# Patient Record
Sex: Male | Born: 1995 | Race: White | Hispanic: No | Marital: Married | Smoking: Current every day smoker
Health system: Southern US, Community
[De-identification: ages and names within clinical notes are randomized; demographics above are authoritative.]

## PROBLEM LIST (undated history)

## (undated) DIAGNOSIS — N200 Calculus of kidney: Secondary | ICD-10-CM

## (undated) HISTORY — DX: Calculus of kidney: N20.0

---
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
## 2014-09-04 ENCOUNTER — Emergency Department: Payer: Self-pay

## 2014-09-04 ENCOUNTER — Emergency Department
Admission: EM | Admit: 2014-09-04 | Discharge: 2014-09-04 | Disposition: A | Discharge status: Home / Self Care | Payer: Self-pay | Attending: Emergency Medicine | Admitting: Emergency Medicine

## 2014-09-04 DIAGNOSIS — Z720 Tobacco use: Secondary | ICD-10-CM | POA: Insufficient documentation

## 2014-09-04 DIAGNOSIS — R112 Nausea with vomiting, unspecified: Secondary | ICD-10-CM | POA: Insufficient documentation

## 2014-09-04 DIAGNOSIS — R109 Unspecified abdominal pain: Secondary | ICD-10-CM | POA: Insufficient documentation

## 2014-09-04 LAB — URINALYSIS COMPLETE WITH MICROSCOPIC (ARMC ONLY)
BILIRUBIN URINE: NEGATIVE
Glucose, UA: NEGATIVE mg/dL
LEUKOCYTES UA: NEGATIVE
Nitrite: NEGATIVE
PH: 5 (ref 5.0–8.0)
Protein, ur: 100 mg/dL — AB
Specific Gravity, Urine: 1.04 — ABNORMAL HIGH (ref 1.005–1.030)

## 2014-09-04 LAB — BASIC METABOLIC PANEL
ANION GAP: 8 (ref 5–15)
BUN: 24 mg/dL — ABNORMAL HIGH (ref 6–20)
CHLORIDE: 105 mmol/L (ref 101–111)
CO2: 26 mmol/L (ref 22–32)
CREATININE: 1.1 mg/dL (ref 0.61–1.24)
Calcium: 9.5 mg/dL (ref 8.9–10.3)
GFR calc Af Amer: >60 mL/min (ref >60)
GFR calc non Af Amer: >60 mL/min (ref >60)
Glucose, Bld: 109 mg/dL — ABNORMAL HIGH (ref 65–99)
POTASSIUM: 4 mmol/L (ref 3.5–5.1)
Sodium: 139 mmol/L (ref 135–145)

## 2014-09-04 LAB — CBC
HCT: 43.2 % (ref 40.0–52.0)
Hemoglobin: 14.3 g/dL (ref 13.0–18.0)
MCH: 29.6 pg (ref 26.0–34.0)
MCHC: 33.1 g/dL (ref 32.0–36.0)
MCV: 89.5 fL (ref 80.0–100.0)
Platelets: 184 10*3/uL (ref 150–440)
RBC: 4.83 MIL/uL (ref 4.40–5.90)
RDW: 13.4 % (ref 11.5–14.5)
WBC: 11.1 10*3/uL — ABNORMAL HIGH (ref 3.8–10.6)

## 2014-09-04 MED ORDER — HYDROCODONE-ACETAMINOPHEN 5-325 MG PO TABS: 1.0000 | ORAL_TABLET | Freq: Four times a day (QID) | ORAL | Status: DC | PRN

## 2014-09-04 MED ORDER — CEPHALEXIN 500 MG PO CAPS
500.0000 mg | ORAL_CAPSULE | Freq: Once | ORAL | Status: AC
  Administered 2014-09-04: 500 mg via ORAL
  Filled 2014-09-04: qty 1

## 2014-09-04 MED ORDER — ONDANSETRON 4 MG PO TBDP: 4.0000 mg | ORAL_TABLET | Freq: Four times a day (QID) | ORAL | Status: DC | PRN

## 2014-09-04 MED ORDER — CEPHALEXIN 500 MG PO CAPS: 500.0000 mg | ORAL_CAPSULE | Freq: Four times a day (QID) | ORAL | Status: DC

## 2014-09-04 MED ORDER — ONDANSETRON 4 MG PO TBDP
4.0000 mg | ORAL_TABLET | Freq: Once | ORAL | Status: AC
  Administered 2014-09-04: 4 mg via ORAL
  Filled 2014-09-04: qty 1

## 2014-09-04 MED ORDER — IBUPROFEN 600 MG PO TABS
600.0000 mg | ORAL_TABLET | ORAL | Status: AC
  Administered 2014-09-04: 600 mg via ORAL
  Filled 2014-09-04: qty 1

## 2014-09-04 MED ORDER — HYDROCODONE-ACETAMINOPHEN 5-325 MG PO TABS
1.0000 | ORAL_TABLET | Freq: Once | ORAL | Status: AC
  Administered 2014-09-04: 1 via ORAL
  Filled 2014-09-04: qty 1

## 2014-09-04 NOTE — ED Notes (Addendum)
Pt c/o waking this morning earlier and was not able to urinate or only in small amount with a lot of suprapubic pain, later today still unable to urinate and is now having left flank pain with N/V.Marland Kitchen

## 2014-09-04 NOTE — ED Provider Notes (Signed)
[facility] Emergency Department Provider Note  ____________________________________________  Time seen: Approximately 5:24 PM  I have reviewed the triage vital signs and the nursing notes.   HISTORY  Chief Complaint Urinary Retention and Flank Pain    HPI Curtis Edwards is a 19 y.o. male who woke up this morning with a severe pain in his left flank that felt like someone had kicked him repeatedly in the back. He also reports that he's been having a severe urge to urinate since that time. He had some nausea and did vomit once because of pain. No fevers or chills, though he did have some mild "cold" like symptoms earlier last week that resolved.  No penile discharge. 6 with wife only. No groin pain or mass. No testicular pain. No cough, fevers, chills, chest pain or other symptoms. No pain on the right side of the abdomen or right lower abdomen.  He also reports that his pain got quite a bit better about 2 hours ago, but is still aching in the left flank. He is able to urinate, but states he has an urge that he continuously needs to empty his bladder.  Discussed with the patient pain medicine, and he has used Vicodin previously without problems for tooth pain.  History reviewed. No pertinent past medical history.  There are no active problems to display for this patient.   History reviewed. No pertinent past surgical history.  Current Outpatient Rx  Name  Route  Sig  Dispense  Refill  . cephALEXin (KEFLEX) 500 MG capsule   Oral   Take 1 capsule (500 mg total) by mouth 4 (four) times daily.   40 capsule   0   . HYDROcodone-acetaminophen (NORCO/VICODIN) 5-325 MG per tablet   Oral   Take 1 tablet by mouth every 6 (six) hours as needed for moderate pain.   15 tablet   0   . ondansetron (ZOFRAN ODT) 4 MG disintegrating tablet   Oral   Take 1 tablet (4 mg total) by mouth every 6 (six) hours as needed for nausea or vomiting.   20 tablet   0      Allergies Review of patient's allergies indicates no known allergies.  No family history on file.  Social History History  Substance Use Topics  . Smoking status: Current Every Day Smoker    Types: Cigarettes  . Smokeless tobacco: Never Used  . Alcohol Use: No    Review of Systems Constitutional: No fever/chills Eyes: No visual changes. ENT: No sore throat. Cardiovascular: Denies chest pain. Respiratory: Denies shortness of breath. Gastrointestinal: See history of present illness No diarrhea.  No constipation. Genitourinary: Severe urge to urinate, but no pain with urination. Musculoskeletal: Negative for back pain but does have pain around the left flank. Skin: Negative for rash. Neurological: Negative for headaches, focal weakness or numbness.  10-point ROS otherwise negative.  ____________________________________________   PHYSICAL EXAM:  VITAL SIGNS: ED Triage Vitals  Enc Vitals Group     BP 09/04/14 1532 114/76 mmHg     Pulse Rate 09/04/14 1530 78     Resp 09/04/14 1530 18     Temp 09/04/14 1530 97.9 F (36.6 C)     Temp Source 09/04/14 1530 Oral     SpO2 09/04/14 1530 99 %     Weight 09/04/14 1530 120 lb (54.432 kg)     Height 09/04/14 1530 5\' 9"  (1.753 m)     Head Cir --  Peak Flow --      Pain Score 09/04/14 1531 9     Pain Loc --      Pain Edu? --      Excl. in GC? --     Constitutional: Alert and oriented. Well appearing and in no acute distress. Eyes: Conjunctivae are normal. PERRL. EOMI. Head: Atraumatic. Nose: No congestion/rhinnorhea. Mouth/Throat: Mucous membranes are moist.  Oropharynx non-erythematous. Neck: No stridor.   Cardiovascular: Normal rate, regular rhythm. Grossly normal heart sounds.  Good peripheral circulation. Respiratory: Normal respiratory effort.  No retractions. Lungs CTAB. Gastrointestinal: Soft and nontender. No right lower quadrant pain. No distention. No abdominal bruits. Moderate left-sided CVA  tenderness. Normal circumcised penis. Normal testicles nontender. No groin pain or mass. No testicular or scrotal swelling. Musculoskeletal: No lower extremity tenderness nor edema.  No joint effusions. Neurologic:  Normal speech and language. No gross focal neurologic deficits are appreciated. No gait instability. Skin:  Skin is warm, dry and intact. No rash noted. Psychiatric: Mood and affect are normal. Speech and behavior are normal.  ____________________________________________   LABS (all labs ordered are listed, but only abnormal results are displayed)  Labs Reviewed  BASIC METABOLIC PANEL - Abnormal; Notable for the following:    Glucose, Bld 109 (*)    BUN 24 (*)    All other components within normal limits  CBC - Abnormal; Notable for the following:    WBC 11.1 (*)    All other components within normal limits  URINALYSIS COMPLETEWITH MICROSCOPIC ([facility] ONLY) - Abnormal; Notable for the following:    Color, Urine AMBER (*)    APPearance CLOUDY (*)    Ketones, ur TRACE (*)    Specific Gravity, Urine 1.040 (*)    Hgb urine dipstick 3+ (*)    Protein, ur 100 (*)    Bacteria, UA RARE (*)    Squamous Epithelial / LPF 0-5 (*)    All other components within normal limits  URINE CULTURE   ____________________________________________  EKG   ____________________________________________  RADIOLOGY  Final result by Rad Results In Interface (09/04/14 17:58:17)   Narrative:   CLINICAL DATA: Acute left flank pain since this morning. Dysuria, hematuria.  EXAM: CT ABDOMEN AND PELVIS WITHOUT CONTRAST  TECHNIQUE: Multidetector CT imaging of the abdomen and pelvis was performed following the standard protocol without IV contrast.  COMPARISON: None.  FINDINGS: Lung bases are clear. No effusions. Heart is normal size.  Liver, gallbladder, spleen, pancreas, adrenals and left kidney have an unremarkable unenhanced appearance. Punctate nonobstructing stone in the  upper pole of the right kidney. No ureteral stones or hydronephrosis. Urinary bladder is decompressed.  Stomach, large and small bowel are unremarkable. No free fluid, free air or adenopathy. Appendix is visualized, filled with slightly hyperdense material, and unremarkable.  No acute bony abnormality or focal bone lesion.  IMPRESSION: Punctate right upper pole nephrolithiasis. No ureteral stones or hydronephrosis.  No acute findings in the abdomen or pelvis.     ____________________________________________   PROCEDURES  Procedure(s) performed: None  Critical Care performed: No  ____________________________________________  I will prescribe the patient a narcotic pain medicine due to their condition which I anticipate will cause at least moderate pain short term. I discussed with the patient safe use of narcotic pain medicines, and that they are not to drive, work in dangerous areas, or ever take more than prescribed (no more than 1 pill every 6 hours). We discussed that this is the type of medication that "Criss Alvine" may have  overdosed on and the risks of this type of medicine. Patient is very agreeable to only use as prescribed and to never use more than prescribed.   INITIAL IMPRESSION / ASSESSMENT AND PLAN / ED COURSE  Pertinent labs & imaging results that were available during my care of the patient were reviewed by me and considered in my medical decision making (see chart for details).  Sudden left flank pain. Reassuring exam, afebrile with only slightly elevated white count. This likely represents a kidney stone based on my clinical impression. No symptoms of appendicitis or acute intra-abdominal infection. We'll obtain CT imaging to further evaluate as he has no previous history of kidney stone. His symptoms have improved, possibly has passed it. Urinalysis showed lots of red cells, a few white cells and is likely not acutely infected but I will send a culture and place him  on Keflex because a few bacteria are noted along with a few white cells.  I discussed treatment recommendations, return precautions, and close follow-up care. The patient is agreeable. Wife will drive him home. He is to follow-up urology and we discussed that should he develop a fever, shaking chills, abnormal urine odor, white or cloudy urine, severe abdominal pain, weakness, or other new concerns arise that he should return to the emergency room right away.  ----------------------------------------- 6:46 PM on 09/04/2014 -----------------------------------------  Patient feels much improved. Awake and alert in no distress. Reviewed CT demonstrates no acute findings. Based on his hematuria and left flank pain which is improved significantly, I suspect he likely passed a left-sided kidney stone. Return precautions and follow-up care advised. ____________________________________________   FINAL CLINICAL IMPRESSION(S) / ED DIAGNOSES  Final diagnoses:  Left flank pain   hematuria    Sharyn Creamer, MD 09/04/14 1900

## 2014-09-04 NOTE — ED Notes (Signed)
Bladder scan complete with only 16ml noted.Marland Kitchen

## 2014-09-04 NOTE — ED Notes (Signed)
NAD noted at time of D/C. Pt refused wheelchair to the lobby at this time.  

## 2014-09-04 NOTE — ED Notes (Signed)
Pt taken to CT.

## 2014-09-04 NOTE — Discharge Instructions (Signed)
You have been seen in the Emergency Department (ED) today for pain that we believe based on your workup, is caused by kidney stones.  As we have discussed, please drink plenty of fluids.  Please make a follow up appointment with the physician(s) listed elsewhere in this documentation. ° °You may take pain medication as needed but ONLY as prescribed.  Please also take your prescribed Flomax daily.  We also recommend that you take over-the-counter ibuprofen regularly according to label instructions over the next 5 days.  Take it with meals to minimize stomach discomfort. ° °Please see your doctor as soon as possible as stones may take 1-3 weeks to pass and you may require additional care or medications. ° °Do not drink alcohol, drive or participate in any other potentially dangerous activities while taking opiate pain medication as it may make you sleepy. Do not take this medication with any other sedating medications, either prescription or over-the-counter. If you were prescribed Percocet or Vicodin, do not take these with acetaminophen (Tylenol) as it is already contained within these medications. °  °This medication is an opiate (or narcotic) pain medication and can be habit forming.  Use it as little as possible to achieve adequate pain control.  Do not use or use it with extreme caution if you have a history of opiate abuse or dependence.  If you are on a pain contract with your primary care doctor or a pain specialist, be sure to let them know you were prescribed this medication today from the [facility].  This medication is intended for your use only - do not give any to anyone else and keep it in a secure place where nobody else, especially children, have access to it.  It will also cause or worsen constipation, so you may want to consider taking an over-the-counter stool softener while you are taking this medication. ° °Return to the Emergency Department (ED) or call your doctor  if you have any worsening pain, fever, painful urination, are unable to urinate, or develop other symptoms that concern you. ° °

## 2014-09-06 LAB — URINE CULTURE: Special Requests: NORMAL

## 2015-01-30 ENCOUNTER — Emergency Department
Admission: EM | Admit: 2015-01-30 | Discharge: 2015-01-30 | Disposition: A | Discharge status: Home / Self Care | Payer: Self-pay | Attending: Emergency Medicine | Admitting: Emergency Medicine

## 2015-01-30 ENCOUNTER — Encounter: Payer: Self-pay | Admitting: *Deleted

## 2015-01-30 ENCOUNTER — Emergency Department: Payer: Self-pay

## 2015-01-30 DIAGNOSIS — S91321A Laceration with foreign body, right foot, initial encounter: Secondary | ICD-10-CM | POA: Insufficient documentation

## 2015-01-30 DIAGNOSIS — Y9289 Other specified places as the place of occurrence of the external cause: Secondary | ICD-10-CM | POA: Insufficient documentation

## 2015-01-30 DIAGNOSIS — Y9389 Activity, other specified: Secondary | ICD-10-CM | POA: Insufficient documentation

## 2015-01-30 DIAGNOSIS — F17210 Nicotine dependence, cigarettes, uncomplicated: Secondary | ICD-10-CM | POA: Insufficient documentation

## 2015-01-30 DIAGNOSIS — S91311A Laceration without foreign body, right foot, initial encounter: Secondary | ICD-10-CM

## 2015-01-30 DIAGNOSIS — Y998 Other external cause status: Secondary | ICD-10-CM | POA: Insufficient documentation

## 2015-01-30 DIAGNOSIS — W01118A Fall on same level from slipping, tripping and stumbling with subsequent striking against other sharp object, initial encounter: Secondary | ICD-10-CM | POA: Insufficient documentation

## 2015-01-30 MED ORDER — LIDOCAINE-EPINEPHRINE (PF) 1 %-1:200000 IJ SOLN
INTRAMUSCULAR | Status: AC
  Filled 2015-01-30: qty 30

## 2015-01-30 MED ORDER — CEPHALEXIN 500 MG PO CAPS: 500.0000 mg | ORAL_CAPSULE | Freq: Four times a day (QID) | ORAL | Status: DC

## 2015-01-30 MED ORDER — OXYCODONE-ACETAMINOPHEN 5-325 MG PO TABS: 1.0000 | ORAL_TABLET | Freq: Once | ORAL | Status: DC

## 2015-01-30 MED ORDER — OXYCODONE-ACETAMINOPHEN 5-325 MG PO TABS
1.0000 | ORAL_TABLET | Freq: Once | ORAL | Status: AC
  Administered 2015-01-30: 1 via ORAL
  Filled 2015-01-30: qty 1

## 2015-01-30 MED ORDER — HYDROCODONE-ACETAMINOPHEN 5-325 MG PO TABS: 1.0000 | ORAL_TABLET | ORAL | Status: DC | PRN

## 2015-01-30 MED ORDER — LIDOCAINE-EPINEPHRINE (PF) 2 %-1:200000 IJ SOLN
20.0000 mL | Freq: Once | INTRAMUSCULAR | Status: DC
  Filled 2015-01-30: qty 20

## 2015-01-30 MED ORDER — LIDOCAINE HCL (PF) 1 % IJ SOLN
INTRAMUSCULAR | Status: AC
  Filled 2015-01-30: qty 10

## 2015-01-30 NOTE — ED Notes (Signed)
Pt foot placed in 1/2 NS, 1/2 betadine solution at this time.

## 2015-01-30 NOTE — Discharge Instructions (Signed)
Laceration Care, Adult °A laceration is a cut that goes through all of the layers of the skin and into the tissue that is right under the skin. Some lacerations heal on their own. Others need to be closed with stitches (sutures), staples, skin adhesive strips, or skin glue. Proper laceration care minimizes the risk of infection and helps the laceration to heal better. °HOW TO CARE FOR YOUR LACERATION °If sutures or staples were used: °· Keep the wound clean and dry. °· If you were given a bandage (dressing), you should change it at least one time per day or as told by your health care provider. You should also change it if it becomes wet or dirty. °· Keep the wound completely dry for the first 24 hours or as told by your health care provider. After that time, you may shower or bathe. However, make sure that the wound is not soaked in water until after the sutures or staples have been removed. °· Clean the wound one time each day or as told by your health care provider: °· Wash the wound with soap and water. °· Rinse the wound with water to remove all soap. °· Pat the wound dry with a clean towel. Do not rub the wound. °· After cleaning the wound, apply a thin layer of antibiotic ointment as told by your health care provider. This will help to prevent infection and keep the dressing from sticking to the wound. °· Have the sutures or staples removed as told by your health care provider. °If skin adhesive strips were used: °· Keep the wound clean and dry. °· If you were given a bandage (dressing), you should change it at least one time per day or as told by your health care provider. You should also change it if it becomes dirty or wet. °· Do not get the skin adhesive strips wet. You may shower or bathe, but be careful to keep the wound dry. °· If the wound gets wet, pat it dry with a clean towel. Do not rub the wound. °· Skin adhesive strips fall off on their own. You may trim the strips as the wound heals. Do not  remove skin adhesive strips that are still stuck to the wound. They will fall off in time. °If skin glue was used: °· Try to keep the wound dry, but you may briefly wet it in the shower or bath. Do not soak the wound in water, such as by swimming. °· After you have showered or bathed, gently pat the wound dry with a clean towel. Do not rub the wound. °· Do not do any activities that will make you sweat heavily until the skin glue has fallen off on its own. °· Do not apply liquid, cream, or ointment medicine to the wound while the skin glue is in place. Using those may loosen the film before the wound has healed. °· If you were given a bandage (dressing), you should change it at least one time per day or as told by your health care provider. You should also change it if it becomes dirty or wet. °· If a dressing is placed over the wound, be careful not to apply tape directly over the skin glue. Doing that may cause the glue to be pulled off before the wound has healed. °· Do not pick at the glue. The skin glue usually remains in place for 5-10 days, then it falls off of the skin. °General Instructions °· Take over-the-counter and prescription   medicines only as told by your health care provider.  If you were prescribed an antibiotic medicine or ointment, take or apply it as told by your doctor. Do not stop using it even if your condition improves.  To help prevent scarring, make sure to cover your wound with sunscreen whenever you are outside after stitches are removed, after adhesive strips are removed, or when glue remains in place and the wound is healed. Make sure to wear a sunscreen of at least 30 SPF.  Do not scratch or pick at the wound.  Keep all follow-up visits as told by your health care provider. This is important.  Check your wound every day for signs of infection. Watch for:  Redness, swelling, or pain.  Fluid, blood, or pus.  Raise (elevate) the injured area above the level of your heart  while you are sitting or lying down, if possible. SEEK MEDICAL CARE IF:  You received a tetanus shot and you have swelling, severe pain, redness, or bleeding at the injection site.  You have a fever.  A wound that was closed breaks open.  You notice a bad smell coming from your wound or your dressing.  You notice something coming out of the wound, such as wood or glass.  Your pain is not controlled with medicine.  You have increased redness, swelling, or pain at the site of your wound.  You have fluid, blood, or pus coming from your wound.  You notice a change in the color of your skin near your wound.  You need to change the dressing frequently due to fluid, blood, or pus draining from the wound.  You develop a new rash.  You develop numbness around the wound. SEEK IMMEDIATE MEDICAL CARE IF:  You develop severe swelling around the wound.  Your pain suddenly increases and is severe.  You develop painful lumps near the wound or on skin that is anywhere on your body.  You have a red streak going away from your wound.  The wound is on your hand or foot and you cannot properly move a finger or toe.  The wound is on your hand or foot and you notice that your fingers or toes look pale or bluish.   This information is not intended to replace advice given to you by your health care provider. Make sure you discuss any questions you have with your health care provider.   Document Released: 01/22/2005 Document Revised: 06/08/2014 Document Reviewed: 01/18/2014 Elsevier Interactive Patient Education 2016 [employer]  Mechanical Wound Debridement Mechanical wound debridement is a treatment to remove dead tissue from a wound. This helps the wound heal. The treatment involves cleaning the wound (irrigation) and using a pad or gauze (dressing) to remove dead tissue and debris from the wound. There are different types of mechanical wound debridement.  Depending on the wound, you may  need to repeat this procedure or change to another form of debridement as your wound starts to heal. LET [facility] PROVIDER KNOW ABOUT:  Any allergies you have.   All medicines you are taking, including vitamins, herbs, eye drops, creams, and over-the-counter medicines.  Any blood disorders you have.   Any medical conditions you have, including any conditions that:  Cause a significant decrease in blood circulation to the part of the body where the wound is, such as peripheral vascular disease.  Compromise your defense (immune) system or white blood count.  Any surgeries you have had.  Whether you are pregnant  or may be pregnant. RISKS AND COMPLICATIONS Generally, this is a safe procedure. However, problems may occur, including:  Infection.  Bleeding.  Damage to healthy tissue in and around your wound.  Soreness or pain.  Failure of the wound to heal.  Scarring. BEFORE THE PROCEDURE You may be given antibiotic medicine to help prevent infection.  PROCEDURE  Your health care provider may apply a numbing medicine (topical anesthetic) to the wound.  Your health care provider will irrigate your wound with a germ-free (sterile), salt-water (saline) solution. This removes debris, bacteria, and dead tissue.  Depending on what type of mechanical wound debridement you are having, your health care provider may do one of the following:  Put a dressing on your wound. You may have dry gauze pad placed into the wound. Your health care provider will remove the gauze after the wound is dry. Any dead tissue and debris that has dried into the gauze will be lifted out of the wound (wet-to-dry debridement).  Use a type of pad (monofilament fiber debridement pad). This pad has a fluffy surface on one side that picks up dead tissue and debris from your wound. Your health care provider wets the pad and wipes it over your wound for several minutes.  Irrigate your wound with a  pressurized stream of solution such as saline or water.  Once your health care provider is finished, he or she may apply a light dressing to your wound. The procedure may vary among health care providers and hospitals.  AFTER THE PROCEDURE  You may receive medicine for pain.  You will continue to receive antibiotic medicine if it was started before your procedure.   This information is not intended to replace advice given to you by your health care provider. Make sure you discuss any questions you have with your health care provider.   Document Released: 10/13/2014 Document Reviewed: 06/02/2014 Elsevier Interactive Patient Education 2016 [employer].  Stitches, [place], or Adhesive Wound Closure Health care providers use stitches (sutures), staples, and certain glue (skin adhesives) to hold skin together while it heals (wound closure). You may need this treatment after you have surgery or if you cut your skin accidentally. These methods help your skin to heal more quickly and make it less likely that you will have a scar. A wound may take several months to heal completely. The type of wound you have determines when your wound gets closed. In most cases, the wound is closed as soon as possible (primary skin closure). Sometimes, closure is delayed so the wound can be cleaned and allowed to heal naturally. This reduces the chance of infection. Delayed closure may be needed if your wound:  Is caused by a bite.  Happened more than 6 hours ago.  Involves loss of skin or the tissues under the skin.  Has dirt or debris in it that cannot be removed.  Is infected. WHAT ARE THE DIFFERENT KINDS OF WOUND CLOSURES? There are many options for wound closure. The one that your health care provider uses depends on how deep and how large your wound is. Adhesive Glue To use this type of glue to close a wound, your health care provider holds the edges of the wound together and paints the glue on the  surface of your skin. You may need more than one layer of glue. Then the wound may be covered with a light bandage (dressing). This type of skin closure may be used for small wounds that are not deep (  superficial). Using glue for wound closure is less painful than other methods. It does not require a medicine that numbs the area (local anesthetic). This method also leaves nothing to be removed. Adhesive glue is often used for children and on facial wounds. Adhesive glue cannot be used for wounds that are deep, uneven, or bleeding. It is not used inside of a wound.  Adhesive Strips These strips are made of sticky (adhesive), porous paper. They are applied across your skin edges like a regular adhesive bandage. You leave them on until they fall off. Adhesive strips may be used to close very superficial wounds. They may also be used along with sutures to improve the closure of your skin edges.  Sutures Sutures are the oldest method of wound closure. Sutures can be made from natural substances, such as silk, or from synthetic materials, such as nylon and steel. They can be made from a material that your body can break down as your wound heals (absorbable), or they can be made from a material that needs to be removed from your skin (nonabsorbable). They come in many different strengths and sizes. Your health care provider attaches the sutures to a steel needle on one end. Sutures can be passed through your skin, or through the tissues beneath your skin. Then they are tied and cut. Your skin edges may be closed in one continuous stitch or in separate stitches. Sutures are strong and can be used for all kinds of wounds. Absorbable sutures may be used to close tissues under the skin. The disadvantage of sutures is that they may cause skin reactions that lead to infection. Nonabsorbable sutures need to be removed. Staples When surgical staples are used to close a wound, the edges of your skin on both sides of the  wound are brought close together. A staple is placed across the wound, and an instrument secures the edges together. Staples are often used to close surgical cuts (incisions). Staples are faster to use than sutures, and they cause less skin reaction. Staples need to be removed using a tool that bends the staples away from your skin. HOW DO I CARE FOR MY WOUND CLOSURE?  Take medicines only as directed by your health care provider.  If you were prescribed an antibiotic medicine for your wound, finish it all even if you start to feel better.  Use ointments or creams only as directed by your health care provider.  Wash your hands with soap and water before and after touching your wound.  Do not soak your wound in water. Do not take baths, swim, or use a hot tub until your health care provider approves.  Ask your health care provider when you can start showering. Cover your wound if directed by your health care provider.  Do not take out your own sutures or staples.  Do not pick at your wound. Picking can cause an infection.  Keep all follow-up visits as directed by your health care provider. This is important. HOW LONG WILL I HAVE MY WOUND CLOSURE?  Leave adhesive glue on your skin until the glue peels away.  Leave adhesive strips on your skin until the strips fall off.  Absorbable sutures will dissolve within several days.  Nonabsorbable sutures and staples must be removed. The location of the wound will determine how long they stay in. This can range from several days to a couple of weeks. WHEN SHOULD I SEEK HELP FOR MY WOUND CLOSURE? Contact your health care provider if:  You have a fever.  You have chills.  You have drainage, redness, swelling, or pain at your wound.  There is a bad smell coming from your wound.  The skin edges of your wound start to separate after your sutures have been removed.  Your wound becomes thick, raised, and darker in color after your sutures come  out (scarring).   This information is not intended to replace advice given to you by your health care provider. Make sure you discuss any questions you have with your health care provider.   Document Released: 10/17/2000 Document Revised: 02/12/2014 Document Reviewed: 07/01/2013 Elsevier Interactive Patient Education [employer]

## 2015-01-30 NOTE — ED Notes (Signed)
Suturing complete; pt tolerated well; discharge pending

## 2015-01-30 NOTE — ED Notes (Signed)
Pt has laceration to bottom of right foot.  Bleeding controlled.  Pt fell onto stump while wearing flipflops out of a tree approx 8 feet.  No loc.  No neck pain    No back pain.  Pt has abrasion to left mid back area.  Pt anxious.

## 2015-01-30 NOTE — ED Provider Notes (Signed)
[facility] Emergency Department Provider Note  ____________________________________________  Time seen: Approximately 8:05 PM  I have reviewed the triage vital signs and the nursing notes.   HISTORY  Chief Complaint Extremity Laceration    HPI Curtis Edwards is a 19 y.o. male who presents emergency department for a laceration to the plantar aspect of his right foot. Patient states that he was climbing a tree retrieving some ice Christmas present when he accidentally fell landing on his right foot. He states that his foot hit a dad tree stump. He endorses a significant laceration to plantar aspect of his right foot. Patient states that bleeding is controlled prior to arrival. Patient is endorsing significant pain to area. Patient states that he is current on his tetanus immunization.   No past medical history on file.  There are no active problems to display for this patient.   No past surgical history on file.  Current Outpatient Rx  Name  Route  Sig  Dispense  Refill  . cephALEXin (KEFLEX) 500 MG capsule   Oral   Take 1 capsule (500 mg total) by mouth 4 (four) times daily.   28 capsule   0   . HYDROcodone-acetaminophen (NORCO/VICODIN) 5-325 MG tablet   Oral   Take 1 tablet by mouth every 4 (four) hours as needed for moderate pain.   20 tablet   0   . ondansetron (ZOFRAN ODT) 4 MG disintegrating tablet   Oral   Take 1 tablet (4 mg total) by mouth every 6 (six) hours as needed for nausea or vomiting.   20 tablet   0     Allergies Review of patient's allergies indicates no known allergies.  No family history on file.  Social History Social History  Substance Use Topics  . Smoking status: Current Every Day Smoker    Types: Cigarettes  . Smokeless tobacco: Never Used  . Alcohol Use: No    Review of Systems Constitutional: No fever/chills Eyes: No visual changes. ENT: No sore throat. Cardiovascular: Denies chest pain. Respiratory:  Denies shortness of breath. Gastrointestinal: No abdominal pain.  No nausea, no vomiting.  No diarrhea.  No constipation. Genitourinary: Negative for dysuria. Musculoskeletal: Negative for back pain. Skin: Negative for rash. Endorses laceration to the plantar aspect of right foot. Neurological: Negative for headaches, focal weakness or numbness.  10-point ROS otherwise negative.  ____________________________________________   PHYSICAL EXAM:  VITAL SIGNS: ED Triage Vitals  Enc Vitals Group     BP 01/30/15 1545 120/62 mmHg     Pulse Rate 01/30/15 1545 105     Resp 01/30/15 1545 22     Temp 01/30/15 1545 98.7 F (37.1 C)     Temp Source 01/30/15 1545 Oral     SpO2 01/30/15 1545 99 %     Weight 01/30/15 1545 115 lb (52.164 kg)     Height 01/30/15 1545 5\' 10"  (1.778 m)     Head Cir --      Peak Flow --      Pain Score 01/30/15 1547 10     Pain Loc --      Pain Edu? --      Excl. in GC? --     Constitutional: Alert and oriented. Well appearing and in no acute distress. Eyes: Conjunctivae are normal. PERRL. EOMI. Head: Atraumatic. Nose: No congestion/rhinnorhea. Mouth/Throat: Mucous membranes are moist.  Oropharynx non-erythematous. Neck: No stridor.   Cardiovascular: Normal rate, regular rhythm. Grossly normal heart sounds.  Good  peripheral circulation. Respiratory: Normal respiratory effort.  No retractions. Lungs CTAB. Gastrointestinal: Soft and nontender. No distention. No abdominal bruits. No CVA tenderness. Musculoskeletal: No lower extremity tenderness nor edema.  No joint effusions. Neurologic:  Normal speech and language. No gross focal neurologic deficits are appreciated. No gait instability. Skin:  Skin is warm, dry and intact. No rash noted. Significant laceration is noted to the plantar aspect of right foot. Wound is grossly contaminated with vegetation. Laceration is jagged in nature, and approximately 7 cm in length. Bleeding is controlled. Laceration extends from  the ball of the foot through the third toe right foot. Sensation and cap refill are present. Psychiatric: Mood and affect are normal. Speech and behavior are normal.  ____________________________________________   LABS (all labs ordered are listed, but only abnormal results are displayed)  Labs Reviewed - No data to display ____________________________________________  EKG   ____________________________________________  RADIOLOGY  Foot x-ray Impression: No acute osseous abnormality. Possible foreign body in the soft tissues of the plantar surface of the foot. ____________________________________________   PROCEDURES  Procedure(s) performed: Yes, laceration repair and wound debridement, see procedure note(s).   The wound is cleansed, debrided of foreign material as much as possible, and sutured. The patient is alerted to watch for any signs of infection (redness, pus, pain, increased swelling or fever) and call if such occurs. Home wound care instructions are provided.   LACERATION REPAIR Performed by: Racheal Patches Authorized by: Delorise Royals Tonesha Tsou Consent: Verbal consent obtained. Risks and benefits: risks, benefits and alternatives were discussed Consent given by: patient Patient identity confirmed: provided demographic data Prepped and Draped in normal sterile fashion Wound explored  Laceration Location: Plantar aspect right foot  Laceration Length: 7 cm  No Foreign Bodies seen or palpated  Anesthesia: local infiltration  Local anesthetic: lidocaine 1 % with epinephrine for plantar foot, lidocaine 1% without epi for third toe   Anesthetic total: 15 mL's of lidocaine with epi to plantar foot, 5 ml of lidocaine without epi for third toe   Irrigation method: syringe Amount of cleaning: standard  Skin closure: 2-0 braided suture, 4-0 Ethilon suture   Number of sutures: 15 total, 8 2-0 sutures to plantar foot, 7 4-0 sutures to 3rd toe  Technique:  Simple interrupted   Patient tolerance: Patient tolerated the procedure well with no immediate complications.  Wound was thoroughly irrigated, cleansed, and debrided prior to closure. Significant debris was removed from wound. Wound was closed using 2-0 and 4-0 sutures. Patient tolerated procedure well.    Critical Care performed: No  ____________________________________________   INITIAL IMPRESSION / ASSESSMENT AND PLAN / ED COURSE  Pertinent labs & imaging results that were available during my care of the patient were reviewed by me and considered in my medical decision making (see chart for details).  She presented with a significant laceration to the plantar aspect of right foot. Edges were jagged and obviously contaminated. X-ray resulted in no osseous abnormality but did reveal foreign bodies. Wound was thoroughly irrigated, debris needed, and cleansed prior to closure. 15 sutures in the simple interrupted pattern were placed for closure. Patient tolerated procedure well. Patient is placed on antibiotics prophylactically due to the contaminated nature of wound. Patient was current on his tetanus immunization. Patient is given strict ED precautions to return for any signs of infection. Patient verbalizes understanding and agreement with this plan. He will follow-up with primary care or urgent care in 7-10 days for suture removal. Patient is also provided with  prescription for pain medication as well as crutches.    New Prescriptions   CEPHALEXIN (KEFLEX) 500 MG CAPSULE    Take 1 capsule (500 mg total) by mouth 4 (four) times daily.   HYDROCODONE-ACETAMINOPHEN (NORCO/VICODIN) 5-325 MG TABLET    Take 1 tablet by mouth every 4 (four) hours as needed for moderate pain.    ____________________________________________   FINAL CLINICAL IMPRESSION(S) / ED DIAGNOSES  Final diagnoses:  Foot laceration, right, initial encounter      Racheal Patches, PA-C 01/30/15 2016  Governor Rooks, MD 01/30/15 2205

## 2015-06-22 ENCOUNTER — Emergency Department
Admission: EM | Admit: 2015-06-22 | Discharge: 2015-06-22 | Disposition: A | Discharge status: Home / Self Care | Payer: Self-pay | Attending: Emergency Medicine | Admitting: Emergency Medicine

## 2015-06-22 ENCOUNTER — Encounter: Payer: Self-pay | Admitting: Emergency Medicine

## 2015-06-22 DIAGNOSIS — Y990 Civilian activity done for income or pay: Secondary | ICD-10-CM | POA: Insufficient documentation

## 2015-06-22 DIAGNOSIS — Z79899 Other long term (current) drug therapy: Secondary | ICD-10-CM | POA: Insufficient documentation

## 2015-06-22 DIAGNOSIS — T148XXA Other injury of unspecified body region, initial encounter: Secondary | ICD-10-CM

## 2015-06-22 DIAGNOSIS — X500XXA Overexertion from strenuous movement or load, initial encounter: Secondary | ICD-10-CM | POA: Insufficient documentation

## 2015-06-22 DIAGNOSIS — M7080 Other soft tissue disorders related to use, overuse and pressure of unspecified site: Secondary | ICD-10-CM

## 2015-06-22 DIAGNOSIS — S46911A Strain of unspecified muscle, fascia and tendon at shoulder and upper arm level, right arm, initial encounter: Secondary | ICD-10-CM | POA: Insufficient documentation

## 2015-06-22 DIAGNOSIS — Y929 Unspecified place or not applicable: Secondary | ICD-10-CM | POA: Insufficient documentation

## 2015-06-22 DIAGNOSIS — Y9389 Activity, other specified: Secondary | ICD-10-CM | POA: Insufficient documentation

## 2015-06-22 DIAGNOSIS — Z792 Long term (current) use of antibiotics: Secondary | ICD-10-CM | POA: Insufficient documentation

## 2015-06-22 MED ORDER — TRAMADOL HCL 50 MG PO TABS: 50.0000 mg | ORAL_TABLET | Freq: Four times a day (QID) | ORAL | Status: DC | PRN

## 2015-06-22 MED ORDER — CYCLOBENZAPRINE HCL 10 MG PO TABS
10.0000 mg | ORAL_TABLET | Freq: Once | ORAL | Status: AC
  Administered 2015-06-22: 10 mg via ORAL
  Filled 2015-06-22: qty 1

## 2015-06-22 MED ORDER — CYCLOBENZAPRINE HCL 10 MG PO TABS: 10.0000 mg | ORAL_TABLET | Freq: Three times a day (TID) | ORAL | Status: DC | PRN

## 2015-06-22 MED ORDER — TRAMADOL HCL 50 MG PO TABS
50.0000 mg | ORAL_TABLET | Freq: Once | ORAL | Status: AC
Start: 2015-06-22 — End: 2015-06-22
  Administered 2015-06-22: 50 mg via ORAL
  Filled 2015-06-22: qty 1

## 2015-06-22 NOTE — ED Notes (Signed)
States he felt pain to right mid back when trying to placed on pants  Then this am states pain became worse when trying to lift a heavy box

## 2015-06-22 NOTE — ED Notes (Signed)
At home bending over felt pain in the upper mid back pian, then went to work and went to pick a box up the pain got worse. Left work to come get evaluated

## 2015-06-22 NOTE — ED Provider Notes (Signed)
[facility] Emergency Department Provider Note  ____________________________________________  Time seen: Approximately 10:08 AM  I have reviewed the triage vital signs and the nursing notes.   HISTORY  Chief Complaint Back Pain   Historian Patient    HPI Curtis Edwards is a 20 y.o. male patient complaining of right upper posterior shoulder pain for 2 days. Patient stated this morning when he bent over 2. No driving he felt pain in right scapular area. Patient said he went to work today and one episode of  heavy liftingcaused similar pain. Patient states his job requires repetitive heavy lifting. Patient also states his job requires repetitive reaching and pulling across [employer] to move objects. Patient rated his pain as constant 3/10. Patient states pain increased to a 10 over 10 with lifting. No palliative measures taken prior to arrival.  History reviewed. No pertinent past medical history.   Immunizations up to date:  Yes.    There are no active problems to display for this patient.   History reviewed. No pertinent past surgical history.  Current Outpatient Rx  Name  Route  Sig  Dispense  Refill  . cephALEXin (KEFLEX) 500 MG capsule   Oral   Take 1 capsule (500 mg total) by mouth 4 (four) times daily.   28 capsule   0   . cyclobenzaprine (FLEXERIL) 10 MG tablet   Oral   Take 1 tablet (10 mg total) by mouth every 8 (eight) hours as needed for muscle spasms.   15 tablet   0   . HYDROcodone-acetaminophen (NORCO/VICODIN) 5-325 MG tablet   Oral   Take 1 tablet by mouth every 4 (four) hours as needed for moderate pain.   20 tablet   0   . ondansetron (ZOFRAN ODT) 4 MG disintegrating tablet   Oral   Take 1 tablet (4 mg total) by mouth every 6 (six) hours as needed for nausea or vomiting.   20 tablet   0   . traMADol (ULTRAM) 50 MG tablet   Oral   Take 1 tablet (50 mg total) by mouth every 6 (six) hours as needed.   20 tablet   0      Allergies Review of patient's allergies indicates no known allergies.  No family history on file.  Social History Social History  Substance Use Topics  . Smoking status: Never Smoker   . Smokeless tobacco: Never Used  . Alcohol Use: No    Review of Systems Constitutional: No fever.  Baseline level of activity. Eyes: No visual changes.  No red eyes/discharge. ENT: No sore throat.  Not pulling at ears. Cardiovascular: Negative for chest pain/palpitations. Respiratory: Negative for shortness of breath. Gastrointestinal: No abdominal pain.  No nausea, no vomiting.  No diarrhea.  No constipation. Genitourinary: Negative for dysuria.  Normal urination. Musculoskeletal: Right posterior shoulder pain Skin: Negative for rash. Neurological: Negative for headaches, focal weakness or numbness.    ____________________________________________   PHYSICAL EXAM:  VITAL SIGNS: ED Triage Vitals  Enc Vitals Group     BP 06/22/15 0937 113/53 mmHg     Pulse Rate 06/22/15 0937 93     Resp 06/22/15 0937 18     Temp 06/22/15 0937 98.2 F (36.8 C)     Temp Source 06/22/15 0937 Oral     SpO2 06/22/15 0937 100 %     Weight 06/22/15 0937 120 lb (54.432 kg)     Height 06/22/15 0937 5\' 10"  (1.778 m)  Head Cir --      Peak Flow --      Pain Score 06/22/15 0937 3     Pain Loc --      Pain Edu? --      Excl. in GC? --     Constitutional: Alert, attentive, and oriented appropriately for age. Well appearing and in no acute distress.  Eyes: Conjunctivae are normal. PERRL. EOMI. Head: Atraumatic and normocephalic. Nose: No congestion/rhinorrhea. Mouth/Throat: Mucous membranes are moist.  Oropharynx non-erythematous. Neck: No stridor.  No cervical spine tenderness to palpation. Cardiovascular: Normal rate, regular rhythm. Grossly normal heart sounds.  Good peripheral circulation with normal cap refill. Respiratory: Normal respiratory effort.  No retractions. Lungs CTAB with no  W/R/R. Gastrointestinal: Soft and nontender. No distention. Musculoskeletal: No obvious deformities to the right upper extremity. Patient has some moderate guarding of the medial scapular muscle area. It against resistance is 3/5. Patient has full range of motion. Weight-bearing without difficulty. Neurologic:  Appropriate for age. No gross focal neurologic deficits are appreciated.  No gait instability.   Speech is normal.   Skin:  Skin is warm, dry and intact. No rash noted.  Psychiatric: Mood and affect are normal. Speech and behavior are normal.  ____________________________________________   LABS (all labs ordered are listed, but only abnormal results are displayed)  Labs Reviewed - No data to display ____________________________________________  RADIOLOGY  No results found. ____________________________________________   PROCEDURES  Procedure(s) performed: None  Critical Care performed: No  ____________________________________________   INITIAL IMPRESSION / ASSESSMENT AND PLAN / ED COURSE  Pertinent labs & imaging results that were available during my care of the patient were reviewed by me and considered in my medical decision making (see chart for details).  Right scapular muscle strain. Patient given discharge care instructions. Patient given a prescription for tramadol and Robaxin. Patient given a work note for today. Patient [facility]. ____________________________________________   FINAL CLINICAL IMPRESSION(S) / ED DIAGNOSES  Final diagnoses:  Muscle strain of right scapular region, initial encounter  Repetitive motion injury     New Prescriptions   CYCLOBENZAPRINE (FLEXERIL) 10 MG TABLET    Take 1 tablet (10 mg total) by mouth every 8 (eight) hours as needed for muscle spasms.   TRAMADOL (ULTRAM) 50 MG TABLET    Take 1 tablet (50 mg total) by mouth every 6 (six) hours as needed.      Joni Reining, PA-C 06/22/15 1021  Sharman Cheek, MD 06/22/15 [phone]

## 2015-06-22 NOTE — Discharge Instructions (Signed)
Muscle Strain °A muscle strain (pulled muscle) happens when a muscle is stretched beyond normal length. It happens when a sudden, violent force stretches your muscle too far. Usually, a few of the fibers in your muscle are torn. Muscle strain is common in athletes. Recovery usually takes 1-2 weeks. Complete healing takes 5-6 weeks.  °HOME CARE  °· Follow the PRICE method of treatment to help your injury get better. Do this the first 2-3 days after the injury: °¨ Protect. Protect the muscle to keep it from getting injured again. °¨ Rest. Limit your activity and rest the injured body part. °¨ Ice. Put ice in a plastic bag. Place a towel between your skin and the bag. Then, apply the ice and leave it on from 15-20 minutes each hour. After the third day, switch to moist heat packs. °¨ Compression. Use a splint or elastic bandage on the injured area for comfort. Do not put it on too tightly. °¨ Elevate. Keep the injured body part above the level of your heart. °· Only take medicine as told by your doctor. °· Warm up before doing exercise to prevent future muscle strains. °GET HELP IF:  °· You have more pain or puffiness (swelling) in the injured area. °· You feel numbness, tingling, or notice a loss of strength in the injured area. °MAKE SURE YOU:  °· Understand these instructions. °· Will watch your condition. °· Will get help right away if you are not doing well or get worse. °  °This information is not intended to replace advice given to you by your health care provider. Make sure you discuss any questions you have with your health care provider. °  °Document Released: 11/01/2007 Document Revised: 11/12/2012 Document Reviewed: 08/21/2012 °Elsevier Interactive Patient Education ©2016 Elsevier Inc. ° °

## 2015-07-13 ENCOUNTER — Emergency Department
Admission: EM | Admit: 2015-07-13 | Discharge: 2015-07-13 | Disposition: A | Discharge status: Home / Self Care | Payer: Self-pay | Attending: Emergency Medicine | Admitting: Emergency Medicine

## 2015-07-13 ENCOUNTER — Encounter: Payer: Self-pay | Admitting: Emergency Medicine

## 2015-07-13 DIAGNOSIS — Z79899 Other long term (current) drug therapy: Secondary | ICD-10-CM | POA: Insufficient documentation

## 2015-07-13 DIAGNOSIS — R55 Syncope and collapse: Secondary | ICD-10-CM | POA: Insufficient documentation

## 2015-07-13 DIAGNOSIS — Z792 Long term (current) use of antibiotics: Secondary | ICD-10-CM | POA: Insufficient documentation

## 2015-07-13 NOTE — Discharge Instructions (Signed)
Return to the ER if you pass out, chest pain, difficulty breathing, or for any other concerns.  Near-Syncope Near-syncope (commonly known as near fainting) is sudden weakness, dizziness, or feeling like you might pass out. This can happen when getting up or while standing for a long time. It is caused by a sudden decrease in blood flow to the brain, which can occur for various reasons. Most of the reasons are not serious.  HOME CARE Watch your condition for any changes.  Have someone stay with you until you feel stable.  If you feel like you are going to pass out:  Lie down right away.  Prop your feet up if you can.  Breathe deeply and steadily.  Move only when the feeling has gone away. Most of the time, this feeling lasts only a few minutes. You may feel tired for several hours.  Drink enough fluids to keep your pee (urine) clear or pale yellow.  If you are taking blood pressure or heart medicine, stand up slowly.  Follow up with your doctor as told. GET HELP RIGHT AWAY IF:   You have a severe headache.  You have unusual pain in the chest, belly (abdomen), or back.  You have bleeding from the mouth or butt (rectum), or you have black or tarry poop (stool).  You feel your heart beat differently than normal, or you have a very fast pulse.  You pass out, or you twitch and shake when you pass out.  You pass out when sitting or lying down.  You feel confused.  You have trouble walking.  You are weak.  You have vision problems. MAKE SURE YOU:   Understand these instructions.  Will watch your condition.  Will get help right away if you are not doing well or get worse.   This information is not intended to replace advice given to you by your health care provider. Make sure you discuss any questions you have with your health care provider.   Document Released: 07/11/2007 Document Revised: 02/12/2014 Document Reviewed: 06/27/2012 Elsevier Interactive Patient Education  [employer] Elsevier Inc.

## 2015-07-13 NOTE — ED Notes (Signed)
Pt states has been having episodes of where he feels like he is going to pass out. Pt c/o episodes going on for three to four years. Worse when changing positions. States is making him "feel weird".

## 2015-07-13 NOTE — ED Provider Notes (Addendum)
[facility] Emergency Department Provider Note ____________________________________________  Time seen: Approximately 8:52 AM  I have reviewed the triage vital signs and the nursing notes.   HISTORY  Chief Complaint Near Syncope    HPI Curtis Edwards is a 20 y.o. male with no significant past medical history who presents for 4-5 years of noticing that he gets lightheaded when he stands up too quickly especially after he has been squatting. He states his vision goes dim but then after standing for a few minutes symptoms completely resolve. He is worried that something is wrong with him. 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 He denies any chest pain, shortness of breath, headache, generalized weakness, recent illness. He has never had a syncopal episode.  History reviewed. No pertinent past medical history.  There are no active problems to display for this patient.   History reviewed. No pertinent past surgical history.  Current Outpatient Rx  Name  Route  Sig  Dispense  Refill  . cephALEXin (KEFLEX) 500 MG capsule   Oral   Take 1 capsule (500 mg total) by mouth 4 (four) times daily.   28 capsule   0   . cyclobenzaprine (FLEXERIL) 10 MG tablet   Oral   Take 1 tablet (10 mg total) by mouth every 8 (eight) hours as needed for muscle spasms.   15 tablet   0   . HYDROcodone-acetaminophen (NORCO/VICODIN) 5-325 MG tablet   Oral   Take 1 tablet by mouth every 4 (four) hours as needed for moderate pain.   20 tablet   0   . ondansetron (ZOFRAN ODT) 4 MG disintegrating tablet   Oral   Take 1 tablet (4 mg total) by mouth every 6 (six) hours as needed for nausea or vomiting.   20 tablet   0   . traMADol (ULTRAM) 50 MG tablet   Oral   Take 1 tablet (50 mg total) by mouth every 6 (six) hours as needed.   20 tablet   0     Allergies Review of patient's allergies indicates no known  allergies.  No family history on file.  Social History Social History  Substance Use Topics  . Smoking status: Never Smoker   . Smokeless tobacco: Never Used  . Alcohol Use: No  He does endorse smoking to me.  Review of Systems Constitutional: No fever Eyes: No visual changes. ENT: No sore throat. Cardiovascular: Denies chest pain. Respiratory: Denies shortness of breath. Gastrointestinal: No abdominal pain.  No nausea, no vomiting.   Neurological: Negative for headaches.  10-point ROS otherwise negative.  ____________________________________________   PHYSICAL EXAM:  VITAL SIGNS: ED Triage Vitals  Enc Vitals Group     BP 07/13/15 0848 121/81 mmHg     Pulse Rate 07/13/15 0848 75     Resp 07/13/15 0848 20     Temp 07/13/15 0848 97.7 F (36.5 C)     Temp Source 07/13/15 0848 Oral     SpO2 07/13/15 0848 100 %     Weight 07/13/15 0848 120 lb (54.432 kg)     Height 07/13/15 0848 5\' 10"  (1.778 m)     Head Cir --      Peak Flow --      Pain Score --      Pain Loc --      Pain Edu? --      Excl. in GC? --  Constitutional: Alert and oriented. Well appearing and in no acute distress. Eyes: Conjunctivae are normal. PERRL. EOMI. Head: Atraumatic. Nose: No congestion/rhinnorhea. Mouth/Throat: Mucous membranes are moist.  Oropharynx non-erythematous. Neck: No stridor.   Cardiovascular: Normal rate, regular rhythm. Grossly normal heart sounds.  Good peripheral circulation. Respiratory: Normal respiratory effort.  No retractions. Lungs CTAB. Gastrointestinal: Soft and nontender. No distention. No abdominal bruits. No CVA tenderness. Musculoskeletal: No lower extremity tenderness nor edema.   Neurologic:  Normal speech and language. No gross focal neurologic deficits are appreciated. No gait instability. Skin:  Skin is warm, dry and intact. No rash noted. Psychiatric: Mood and affect are normal. Speech and behavior are  normal.  ____________________________________________  ____________________________________________  Date: 07/13/2015  Rate: 73  Rhythm: normal sinus rhythm  QRS Axis: normal  Intervals: normal  ST/T Wave abnormalities: normal  Conduction Disutrbances: none  Narrative Interpretation: unremarkable       INITIAL IMPRESSION / ASSESSMENT AND PLAN / ED COURSE  Pertinent labs & imaging results that were available during my care of the patient were reviewed by me and considered in my medical decision making (see chart for details).  Past records reviewed to cover the time in which patient reports symptoms. Patient had a CT abdomen pelvis in July 2016 which showed that his heart is normal size. He also had basic chemistries and a CBC done at that time which were both normal. Orthostatics today are negative.  Discussed with patient smoking cessation and need to stay hydrated. ____________________________________________   FINAL CLINICAL IMPRESSION(S) / ED DIAGNOSES Near syncope  New prescriptions started this visit New Prescriptions   No medications on file     Maurilio Lovely, MD 07/13/15 1009  Maurilio Lovely, MD 07/13/15 1010

## 2015-07-19 ENCOUNTER — Encounter: Payer: Self-pay | Admitting: Emergency Medicine

## 2015-07-19 ENCOUNTER — Emergency Department
Admission: EM | Admit: 2015-07-19 | Discharge: 2015-07-19 | Disposition: A | Discharge status: Home / Self Care | Payer: Self-pay | Attending: Emergency Medicine | Admitting: Emergency Medicine

## 2015-07-19 DIAGNOSIS — Y990 Civilian activity done for income or pay: Secondary | ICD-10-CM | POA: Insufficient documentation

## 2015-07-19 DIAGNOSIS — S161XXA Strain of muscle, fascia and tendon at neck level, initial encounter: Secondary | ICD-10-CM | POA: Insufficient documentation

## 2015-07-19 DIAGNOSIS — Y929 Unspecified place or not applicable: Secondary | ICD-10-CM | POA: Insufficient documentation

## 2015-07-19 DIAGNOSIS — Y9389 Activity, other specified: Secondary | ICD-10-CM | POA: Insufficient documentation

## 2015-07-19 DIAGNOSIS — X501XXA Overexertion from prolonged static or awkward postures, initial encounter: Secondary | ICD-10-CM | POA: Insufficient documentation

## 2015-07-19 DIAGNOSIS — F17210 Nicotine dependence, cigarettes, uncomplicated: Secondary | ICD-10-CM | POA: Insufficient documentation

## 2015-07-19 MED ORDER — METHOCARBAMOL 500 MG PO TABS: 500.0000 mg | ORAL_TABLET | Freq: Four times a day (QID) | ORAL | Status: DC | PRN

## 2015-07-19 MED ORDER — NAPROXEN 500 MG PO TABS: 500.0000 mg | ORAL_TABLET | Freq: Two times a day (BID) | ORAL | Status: DC

## 2015-07-19 MED ORDER — DIAZEPAM 5 MG PO TABS
5.0000 mg | ORAL_TABLET | Freq: Once | ORAL | Status: AC
  Administered 2015-07-19: 5 mg via ORAL
  Filled 2015-07-19: qty 1

## 2015-07-19 MED ORDER — IBUPROFEN 800 MG PO TABS
800.0000 mg | ORAL_TABLET | Freq: Once | ORAL | Status: AC
  Administered 2015-07-19: 800 mg via ORAL
  Filled 2015-07-19: qty 1

## 2015-07-19 NOTE — Discharge Instructions (Signed)
Acute Torticollis Torticollis is a condition in which the muscles of the neck tighten (contract) abnormally, causing the neck to twist and the head to move into an unnatural position. Torticollis that develops suddenly is called acute torticollis. If torticollis becomes chronic and is left untreated, the face and neck can become deformed. CAUSES This condition may be caused by:  Sleeping in an awkward position (common).  Extending or twisting the neck muscles beyond their normal position.  Infection. In some cases, the cause may not be known. SYMPTOMS Symptoms of this condition include:  An unnatural position of the head.  Neck pain.  A limited ability to move the neck.  Twisting of the neck to one side. DIAGNOSIS This condition is diagnosed with a physical exam. You may also have imaging tests, such as an X-ray, CT scan, or MRI. TREATMENT Treatment for this condition involves trying to relax the neck muscles. It may include:  Medicines or shots.  Physical therapy.  Surgery. This may be done in severe cases. HOME CARE INSTRUCTIONS  Take medicines only as directed by your health care provider.  Do stretching exercises and massage your neck as directed by your health care provider.  Keep all follow-up visits as directed by your health care provider. This is important. SEEK MEDICAL CARE IF:  You develop a fever. SEEK IMMEDIATE MEDICAL CARE IF:  You develop difficulty breathing.  You develop noisy breathing (stridor).  You start drooling.  You have trouble swallowing or have pain with swallowing.  You develop numbness or weakness in your hands or feet.  You have changes in your speech, understanding, or vision.  Your pain gets worse.   This information is not intended to replace advice given to you by your health care provider. Make sure you discuss any questions you have with your health care provider.   Document Released: 01/20/2000 Document Revised:  06/08/2014 Document Reviewed: 01/18/2014 Elsevier Interactive Patient Education 2016 Elsevier Inc.  Cervical Sprain A cervical sprain is an injury in the neck in which the strong, fibrous tissues (ligaments) that connect your neck bones stretch or tear. Cervical sprains can range from mild to severe. Severe cervical sprains can cause the neck vertebrae to be unstable. This can lead to damage of the spinal cord and can result in serious nervous system problems. The amount of time it takes for a cervical sprain to get better depends on the cause and extent of the injury. Most cervical sprains heal in 1 to 3 weeks. CAUSES  Severe cervical sprains may be caused by:   Contact sport injuries (such as from football, rugby, wrestling, hockey, auto racing, gymnastics, diving, martial arts, or boxing).   Motor vehicle collisions.   Whiplash injuries. This is an injury from a sudden forward and backward whipping movement of the head and neck.  Falls.  Mild cervical sprains may be caused by:   Being in an awkward position, such as while cradling a telephone between your ear and shoulder.   Sitting in a chair that does not offer proper support.   Working at a poorly Landscape architect station.   Looking up or down for long periods of time.  SYMPTOMS   Pain, soreness, stiffness, or a burning sensation in the front, back, or sides of the neck. This discomfort may develop immediately after the injury or slowly, 24 hours or more after the injury.   Pain or tenderness directly in the middle of the back of the neck.   Shoulder or upper  back pain.   Limited ability to move the neck.   Headache.   Dizziness.   Weakness, numbness, or tingling in the hands or arms.   Muscle spasms.   Difficulty swallowing or chewing.   Tenderness and swelling of the neck.  DIAGNOSIS  Most of the time your health care provider can diagnose a cervical sprain by taking your history and doing a  physical exam. Your health care provider will ask about previous neck injuries and any known neck problems, such as arthritis in the neck. X-rays may be taken to find out if there are any other problems, such as with the bones of the neck. Other tests, such as a CT scan or MRI, may also be needed.  TREATMENT  Treatment depends on the severity of the cervical sprain. Mild sprains can be treated with rest, keeping the neck in place (immobilization), and pain medicines. Severe cervical sprains are immediately immobilized. Further treatment is done to help with pain, muscle spasms, and other symptoms and may include:  Medicines, such as pain relievers, numbing medicines, or muscle relaxants.   Physical therapy. This may involve stretching exercises, strengthening exercises, and posture training. Exercises and improved posture can help stabilize the neck, strengthen muscles, and help stop symptoms from returning.  HOME CARE INSTRUCTIONS   Put ice on the injured area.   Put ice in a plastic bag.   Place a towel between your skin and the bag.   Leave the ice on for 15-20 minutes, 3-4 times a day.   If your injury was severe, you may have been given a cervical collar to wear. A cervical collar is a two-piece collar designed to keep your neck from moving while it heals.  Do not remove the collar unless instructed by your health care provider.  If you have long hair, keep it outside of the collar.  Ask your health care provider before making any adjustments to your collar. Minor adjustments may be required over time to improve comfort and reduce pressure on your chin or on the back of your head.  Ifyou are allowed to remove the collar for cleaning or bathing, follow your health care provider's instructions on how to do so safely.  Keep your collar clean by wiping it with mild soap and water and drying it completely. If the collar you have been given includes removable pads, remove them every  1-2 days and hand wash them with soap and water. Allow them to air dry. They should be completely dry before you wear them in the collar.  If you are allowed to remove the collar for cleaning and bathing, wash and dry the skin of your neck. Check your skin for irritation or sores. If you see any, tell your health care provider.  Do not drive while wearing the collar.   Only take over-the-counter or prescription medicines for pain, discomfort, or fever as directed by your health care provider.   Keep all follow-up appointments as directed by your health care provider.   Keep all physical therapy appointments as directed by your health care provider.   Make any needed adjustments to your workstation to promote good posture.   Avoid positions and activities that make your symptoms worse.   Warm up and stretch before being active to help prevent problems.  SEEK MEDICAL CARE IF:   Your pain is not controlled with medicine.   You are unable to decrease your pain medicine over time as planned.   Your activity  level is not improving as expected.  SEEK IMMEDIATE MEDICAL CARE IF:   You develop any bleeding.  You develop stomach upset.  You have signs of an allergic reaction to your medicine.   Your symptoms get worse.   You develop new, unexplained symptoms.   You have numbness, tingling, weakness, or paralysis in any part of your body.  MAKE SURE YOU:   Understand these instructions.  Will watch your condition.  Will get help right away if you are not doing well or get worse.   This information is not intended to replace advice given to you by your health care provider. Make sure you discuss any questions you have with your health care provider.   Document Released: 11/19/2006 Document Revised: 01/27/2013 Document Reviewed: 07/30/2012 Elsevier Interactive Patient Education [employer].

## 2015-07-19 NOTE — ED Provider Notes (Signed)
[facility] Emergency Department Provider Note  ____________________________________________  Time seen: Approximately 1:00 PM  I have reviewed the triage vital signs and the nursing notes.   HISTORY  Chief Complaint Torticollis    HPI Curtis Edwards is a 20 y.o. male who woke up this morning with severe neck pain. Patient states he stepped on his neck wrong. Was at work and felt a muscle pull in the right lateral aspect of his neck. Denies any difficulty swallowing. Increased pain when turning to his head.   History reviewed. No pertinent past medical history.  There are no active problems to display for this patient.   History reviewed. No pertinent past surgical history.  Current Outpatient Rx  Name  Route  Sig  Dispense  Refill  . methocarbamol (ROBAXIN) 500 MG tablet   Oral   Take 1 tablet (500 mg total) by mouth every 6 (six) hours as needed for muscle spasms.   30 tablet   0   . naproxen (NAPROSYN) 500 MG tablet   Oral   Take 1 tablet (500 mg total) by mouth 2 (two) times daily with a meal.   60 tablet   0     Allergies Review of patient's allergies indicates no known allergies.  No family history on file.  Social History Social History  Substance Use Topics  . Smoking status: Current Every Day Smoker -- 0.50 packs/day    Types: Cigarettes  . Smokeless tobacco: Never Used  . Alcohol Use: No    Review of Systems Constitutional: No fever/chills Eyes: No visual changes. ENT: No sore throat. Cardiovascular: Denies chest pain. Respiratory: Denies shortness of breath. Musculoskeletal: Positive for cervical muscle pain. Skin: Negative for rash. Neurological: Negative for headaches, focal weakness or numbness.  10-point ROS otherwise negative.  ____________________________________________   PHYSICAL EXAM:  VITAL SIGNS: ED Triage Vitals  Enc Vitals Group     BP 07/19/15 1239 122/81 mmHg     Pulse Rate 07/19/15 1239 80      Resp 07/19/15 1239 18     Temp 07/19/15 1239 97.8 F (36.6 C)     Temp Source 07/19/15 1239 Oral     SpO2 07/19/15 1239 99 %     Weight 07/19/15 1239 120 lb (54.432 kg)     Height 07/19/15 1239  (1.803 m)     Head Cir --      Peak Flow --      Pain Score 07/19/15 1241 7     Pain Loc --      Pain Edu? --      Excl. in GC? --     Constitutional: Alert and oriented. Well appearing and in no acute distress. Mouth/Throat: Mucous membranes are moist.  Oropharynx non-erythematous. Neck: No stridor.  Minimal range of motion. Increased pain with lateralization and extension. Pain worse when turning to the right Cardiovascular: Normal rate, regular rhythm. Grossly normal heart sounds.  Good peripheral circulation. Respiratory: Normal respiratory effort.  No retractions. Lungs CTAB. Musculoskeletal: No lower extremity tenderness nor edema.  No joint effusions. Neurologic:  Normal speech and language. No gross focal neurologic deficits are appreciated. No gait instability. Skin:  Skin is warm, dry and intact. No rash noted. Psychiatric: Mood and affect are normal. Speech and behavior are normal.  ____________________________________________   LABS (all labs ordered are listed, but only abnormal results are displayed)  Labs Reviewed - No data to display ____________________________________________  EKG   ____________________________________________  RADIOLOGY  ____________________________________________   PROCEDURES  Procedure(s) performed: None  Critical Care performed: No  ____________________________________________   INITIAL IMPRESSION / ASSESSMENT AND PLAN / ED COURSE  Pertinent labs & imaging results that were available during my care of the patient were reviewed by me and considered in my medical decision making (see chart for details).  Acute cervical muscle strain. Rx given for Robaxin 500 mg 4 times a day as well as Naprosyn 500 mg twice a day. Work  excuse 24 hours given. Patient follow-up with PCP or return to ER with worsening symptomology. ____________________________________________   FINAL CLINICAL IMPRESSION(S) / ED DIAGNOSES  Final diagnoses:  Cervical strain, acute, initial encounter     This chart was dictated using voice recognition software/Dragon. Despite best efforts to proofread, errors can occur which can change the meaning. Any change was purely unintentional.   Evangeline Dakinharles M Josecarlos Harriott, PA-C 07/19/15 1341  Rockne MenghiniAnne-Caroline Norman, MD 07/19/15 1531

## 2015-07-19 NOTE — ED Notes (Signed)
C/O neck pain.  Started this morning when he woke up.  Went to work and pain has worsened.

## 2015-07-22 ENCOUNTER — Emergency Department
Admission: EM | Admit: 2015-07-22 | Discharge: 2015-07-22 | Disposition: A | Discharge status: Home / Self Care | Payer: Self-pay | Attending: Emergency Medicine | Admitting: Emergency Medicine

## 2015-07-22 ENCOUNTER — Encounter: Payer: Self-pay | Admitting: Emergency Medicine

## 2015-07-22 DIAGNOSIS — M62838 Other muscle spasm: Secondary | ICD-10-CM | POA: Insufficient documentation

## 2015-07-22 DIAGNOSIS — F17210 Nicotine dependence, cigarettes, uncomplicated: Secondary | ICD-10-CM | POA: Insufficient documentation

## 2015-07-22 MED ORDER — CYCLOBENZAPRINE HCL 10 MG PO TABS: 10.0000 mg | ORAL_TABLET | Freq: Three times a day (TID) | ORAL | Status: DC | PRN

## 2015-07-22 NOTE — ED Notes (Signed)
Pt arrived to ED with complaints of neck and shoulder pain that started on Tuesday. Pt was seen in ED Tuesday but pt states pain hasn't gotten better. Pt states the pain "feels like it's moving from neck to right shoulder".

## 2015-07-22 NOTE — ED Provider Notes (Signed)
[facility] Emergency Department Provider Note  ____________________________________________  Time seen: Approximately 8:46 AM  I have reviewed the triage vital signs and the nursing notes.   HISTORY  Chief Complaint No chief complaint on file.    HPI Curtis Edwards is a 20 y.o. male , NAD, presents to the emergency department with right-sided neck and shoulder pain. States that the pain started earlier in the week. Was seen in this emergency department a couple of days ago but states that the muscle relaxer is not helping. Notes that the Robaxin wears off within approximately 3 hours and he feels the pain again. Has used Flexeril in the past which has helped. Denies any numbness, weakness, tingling. Has not had any falls, injuries, traumas to the area. Has not had any headaches, fever, chills, rash, skin sores noted. Has had full range of motion of his upper extremities without any pain.   History reviewed. No pertinent past medical history.  There are no active problems to display for this patient.   History reviewed. No pertinent past surgical history.  Current Outpatient Rx  Name  Route  Sig  Dispense  Refill  . cyclobenzaprine (FLEXERIL) 10 MG tablet   Oral   Take 1 tablet (10 mg total) by mouth 3 (three) times daily as needed for muscle spasms.   21 tablet   0   . methocarbamol (ROBAXIN) 500 MG tablet   Oral   Take 1 tablet (500 mg total) by mouth every 6 (six) hours as needed for muscle spasms.   30 tablet   0   . naproxen (NAPROSYN) 500 MG tablet   Oral   Take 1 tablet (500 mg total) by mouth 2 (two) times daily with a meal.   60 tablet   0     Allergies Review of patient's allergies indicates no known allergies.  History reviewed. No pertinent family history.  Social History Social History  Substance Use Topics  . Smoking status: Current Every Day Smoker -- 0.50 packs/day    Types: Cigarettes  . Smokeless tobacco: Never Used   . Alcohol Use: No     Review of Systems  Constitutional: No fever/chills Eyes: No visual changes.  ENT: No sore throat. Cardiovascular: No chest pain. Respiratory: No shortness of breath. No wheezing.  Gastrointestinal: No abdominal pain.  No nausea, vomiting.   Musculoskeletal: Positive right neck and shoulder pain with muscle tightness. Negative for back pain.  Skin: Negative for rash, redness, swelling, skin sores, bruising. Neurological: Negative for headaches, focal weakness or numbness. No tingling 10-point ROS otherwise negative.  ____________________________________________   PHYSICAL EXAM:  VITAL SIGNS: ED Triage Vitals  Enc Vitals Group     BP --      Pulse --      Resp --      Temp --      Temp src --      SpO2 --      Weight --      Height --      Head Cir --      Peak Flow --      Pain Score --      Pain Loc --      Pain Edu? --      Excl. in GC? --      Constitutional: Alert and oriented. Well appearing and in no acute distress. Eyes: Conjunctivae are normal.  Head: Atraumatic. Neck: No stridor. No cervical spine tenderness to palpation.Supple with full  range of motion. Trace muscle spasm noted about the right trapezial muscle with trace tenderness to palpation. Hematological/Lymphatic/Immunilogical: No cervical lymphadenopathy. Cardiovascular:  Good peripheral circulation. Respiratory: Normal respiratory effort without tachypnea or retractions.  Musculoskeletal: Visual inspection shows the patient is holding bilateral shoulders high with tension in the trapezial muscle. Patient was asked to take a deep breath and relax and normal station of the shoulders was achieved with decrease in pain. Full range of motion of bilateral upper extremities without pain. No lower extremity tenderness nor edema.  No joint effusions. Neurologic:  Normal speech and language. No gross focal neurologic deficits are appreciated. Normal gait and posture. Skin:  Skin is  warm, dry and intact. No rash, redness, swelling, bruising, skin source noted. Psychiatric: Mood and affect are normal. Speech and behavior are normal. Patient exhibits appropriate insight and judgement.   ____________________________________________   LABS  None ____________________________________________  EKG  None ____________________________________________  RADIOLOGY  None ____________________________________________    PROCEDURES  Procedure(s) performed: None   Medications - No data to display   ____________________________________________   INITIAL IMPRESSION / ASSESSMENT AND PLAN / ED COURSE  Patient's diagnosis is consistent with muscle spasm. Patient will be discharged home with prescriptions for Flexeril to take as directed. Patient is to discontinue use of Robaxin. Advised patient to apply warm heat 20 minutes 3-4 times daily and complete range of motion exercises with his upper extremities and neck as discussed. Patient asked to try to relax his shoulders and upper body as much as possible and to be cognizant of such throughout the day as that can improve his pain. Patient is to follow up with [facility] [place] or [facility] if symptoms persist past this treatment course. Patient is given ED precautions to return to the ED for any worsening or new symptoms.    ____________________________________________  FINAL CLINICAL IMPRESSION(S) / ED DIAGNOSES  Final diagnoses:  Muscle spasm      NEW MEDICATIONS STARTED DURING THIS VISIT:  New Prescriptions   CYCLOBENZAPRINE (FLEXERIL) 10 MG TABLET    Take 1 tablet (10 mg total) by mouth 3 (three) times daily as needed for muscle spasms.         Hope Pigeon, PA-C 07/22/15 4098  Jennye Moccasin, MD 07/22/15 [phone]

## 2015-07-22 NOTE — Discharge Instructions (Signed)
Heat Therapy °Heat therapy can help ease sore, stiff, injured, and tight muscles and joints. Heat relaxes your muscles, which may help ease your pain. Heat therapy should only be used on old, pre-existing, or long-lasting (chronic) injuries. Do not use heat therapy unless told by your doctor. °HOW TO USE HEAT THERAPY °There are several different kinds of heat therapy, including: °· Moist heat pack. °· Warm water bath. °· Hot water bottle. °· Electric heating pad. °· Heated gel pack. °· Heated wrap. °· Electric heating pad. °GENERAL HEAT THERAPY RECOMMENDATIONS  °· Do not sleep while using heat therapy. Only use heat therapy while you are awake. °· Your skin may turn pink while using heat therapy. Do not use heat therapy if your skin turns red. °· Do not use heat therapy if you have new pain. °· High heat or long exposure to heat can cause burns. Be careful when using heat therapy to avoid burning your skin. °· Do not use heat therapy on areas of your skin that are already irritated, such as with a rash or sunburn. °GET HELP IF:  °· You have blisters, redness, swelling (puffiness), or numbness. °· You have new pain. °· Your pain is worse. °MAKE SURE YOU: °· Understand these instructions. °· Will watch your condition. °· Will get help right away if you are not doing well or get worse. °  °This information is not intended to replace advice given to you by your health care provider. Make sure you discuss any questions you have with your health care provider. °  °Document Released: 04/16/2011 Document Revised: 02/12/2014 Document Reviewed: 03/17/2013 °Elsevier Interactive Patient Education ©2016 [employer] °Muscle Cramps and Spasms °Muscle cramps and spasms are when muscles tighten by themselves. They usually get better within minutes. Muscle cramps are painful. They are usually stronger and last longer than muscle spasms. Muscle spasms may or may not be painful. They can last a few seconds or much longer. °HOME  CARE °· Drink enough fluid to keep your pee (urine) clear or pale yellow. °· Massage, stretch, and relax the muscle. °· Use a warm towel, heating pad, or warm shower water on tight muscles. °· Place ice on the muscle if it is tender or in pain. °¨ Put ice in a plastic bag. °¨ Place a towel between your skin and the bag. °¨ Leave the ice on for 15-20 minutes, 03-04 times a day. °· Only take medicine as told by your doctor. °GET HELP RIGHT AWAY IF:  °Your cramps or spasms get worse, happen more often, or do not get better with time. °MAKE SURE YOU: °· Understand these instructions. °· Will watch your condition. °· Will get help right away if you are not doing well or get worse. °  °This information is not intended to replace advice given to you by your health care provider. Make sure you discuss any questions you have with your health care provider. °  °Document Released: 01/05/2008 Document Revised: 05/19/2012 Document Reviewed: 01/09/2012 °Elsevier Interactive Patient Education ©2016 [employer] ° °

## 2015-07-22 NOTE — ED Notes (Signed)
Pt verbalized understanding of discharge instructions. NAD at this time. 

## 2015-08-22 ENCOUNTER — Emergency Department: Payer: Self-pay

## 2015-08-22 ENCOUNTER — Emergency Department
Admission: EM | Admit: 2015-08-22 | Discharge: 2015-08-22 | Disposition: A | Discharge status: Home / Self Care | Payer: Self-pay | Attending: Emergency Medicine | Admitting: Emergency Medicine

## 2015-08-22 DIAGNOSIS — R110 Nausea: Secondary | ICD-10-CM | POA: Insufficient documentation

## 2015-08-22 DIAGNOSIS — R519 Headache, unspecified: Secondary | ICD-10-CM

## 2015-08-22 DIAGNOSIS — F17210 Nicotine dependence, cigarettes, uncomplicated: Secondary | ICD-10-CM | POA: Insufficient documentation

## 2015-08-22 DIAGNOSIS — Z791 Long term (current) use of non-steroidal anti-inflammatories (NSAID): Secondary | ICD-10-CM | POA: Insufficient documentation

## 2015-08-22 DIAGNOSIS — R51 Headache: Secondary | ICD-10-CM | POA: Insufficient documentation

## 2015-08-22 MED ORDER — OXYCODONE-ACETAMINOPHEN 5-325 MG PO TABS
2.0000 | ORAL_TABLET | Freq: Once | ORAL | Status: AC
Start: 2015-08-22 — End: 2015-08-22
  Administered 2015-08-22: 2 via ORAL
  Filled 2015-08-22: qty 2

## 2015-08-22 MED ORDER — BUTALBITAL-APAP-CAFFEINE 50-325-40 MG PO TABS: 2.0000 | ORAL_TABLET | Freq: Once | ORAL | Status: DC

## 2015-08-22 MED ORDER — BUTALBITAL-APAP-CAFFEINE 50-325-40 MG PO TABS: 1.0000 | ORAL_TABLET | Freq: Four times a day (QID) | ORAL | Status: DC | PRN

## 2015-08-22 NOTE — Discharge Instructions (Signed)

## 2015-08-22 NOTE — ED Provider Notes (Signed)
[facility] Emergency Department Provider Note  ____________________________________________  Time seen: Approximately 9:06 AM  I have reviewed the triage vital signs and the nursing notes.   HISTORY  Chief Complaint Neck Pain    HPI Curtis Edwards is a 20 y.o. male presenting to ED for headache.  Patient states he was seen about a month ago for neck spasms laced on Robaxin and Naprosyn and Flexeril at different times. Patient states his cervical neck spasms are much improved after took his last dose of Flexeril he noticed sudden onset of a headache this morning with some sweating. Reports his head feels full. Has never had a headache in the past describes this as worst headache of his life. Positive for nausea negative for vomiting.  No past medical history on file.  There are no active problems to display for this patient.   No past surgical history on file.  Current Outpatient Rx  Name  Route  Sig  Dispense  Refill  . butalbital-acetaminophen-caffeine (FIORICET) 50-325-40 MG tablet   Oral   Take 1-2 tablets by mouth every 6 (six) hours as needed for headache.   20 tablet   0   . cyclobenzaprine (FLEXERIL) 10 MG tablet   Oral   Take 1 tablet (10 mg total) by mouth 3 (three) times daily as needed for muscle spasms.   21 tablet   0   . naproxen (NAPROSYN) 500 MG tablet   Oral   Take 1 tablet (500 mg total) by mouth 2 (two) times daily with a meal.   60 tablet   0     Allergies Review of patient's allergies indicates no known allergies.  No family history on file.  Social History Social History  Substance Use Topics  . Smoking status: Current Every Day Smoker -- 0.50 packs/day    Types: Cigarettes  . Smokeless tobacco: Never Used  . Alcohol Use: No    Review of Systems Constitutional: Positive for night sweats. No fever/chills Eyes: No visual changes. ENT: No sore throat. Cardiovascular: Denies chest pain. Respiratory: Denies  shortness of breath. Gastrointestinal: Positive for nausea.  No abdominal pain, vomiting diarrhea or constipation  Musculoskeletal: Negative for back pain and neck pain Skin: Negative for rash. Neurological: Positive for headache. Negative for focal weakness or numbness.  10-point ROS otherwise negative.  ____________________________________________   PHYSICAL EXAM:  VITAL SIGNS: ED Triage Vitals  Enc Vitals Group     BP 08/22/15 0828 128/91 mmHg     Pulse Rate 08/22/15 0828 92     Resp 08/22/15 0828 16     Temp 08/22/15 0828 98.1 F (36.7 C)     Temp Source 08/22/15 0828 Oral     SpO2 08/22/15 0828 100 %     Weight 08/22/15 0828 120 lb (54.432 kg)     Height 08/22/15 0828  (1.803 m)     Head Cir --      Peak Flow --      Pain Score --      Pain Loc --      Pain Edu? --      Excl. in GC? --    Constitutional: Alert and oriented. Well appearing and in no acute distress. Eyes: Conjunctivae are normal. PERRL.  Head: Atraumatic. Nose: No congestion/rhinnorhea. Mouth/Throat: Mucous membranes are moist.   Neck: No stridor. No tenderness to palpation  Hematological/Lymphatic/Immunilogical: No cervical lymphadenopathy. Cardiovascular: Normal rate, regular rhythm.  Respiratory: Normal respiratory effort.  No retractions. Lungs  CTAB. Gastrointestinal: Soft and nontender. No distention.  Musculoskeletal: No lower extremity tenderness nor edema.  No joint effusions. Neurologic:  Normal speech and language. No gross focal neurologic deficits are appreciated.  Skin:  Skin is warm, dry and intact. No rash noted. Psychiatric: Mood and affect are normal. Speech and behavior are normal.  ____________________________________________   LABS (all labs ordered are listed, but only abnormal results are displayed)  Labs Reviewed - No data to display ____________________________________________  EKG   ____________________________________________  RADIOLOGY Head CT was  negative for any osseous or vascular findings.  ____________________________________________   PROCEDURES  Procedure(s) performed: None  Critical Care performed: No  ____________________________________________   INITIAL IMPRESSION / ASSESSMENT AND PLAN / ED COURSE  Pertinent labs & imaging results that were available during my care of the patient were reviewed by me and considered in my medical decision making (see chart for details). Acute headache probably muscle contraction versus vascular headache. Rx given for Fioricet one to 2 every 4-6 hours as needed for headache. Patient follow-up with his PCP or return here any worsening symptomology. Work excuse 24 hours given. ____________________________________________   FINAL CLINICAL IMPRESSION(S) / ED DIAGNOSES  Final diagnoses:  Headache above the eye region     This chart was dictated using voice recognition software/Dragon. Despite best efforts to proofread, errors can occur which can change the meaning. Any change was purely unintentional.   Curtis Dakinharles M Edyth Glomb, PA-C 08/22/15 [address]1048  Illa Enlow M Shivam Mestas, PA-C 08/22/15 1049  Sharman CheekPhillip Stafford, MD 08/22/15 1535

## 2015-08-22 NOTE — ED Notes (Signed)
Pt states he was seen here last month with c/o right sided neck pain , states the pain is radiating into his head today and has taken the muscle relaxer's and IBU without relief.

## 2015-08-22 NOTE — ED Notes (Signed)
See triage note  States he was seen about 1 month ago for muscle spasms in neck. States he started back again with neck pain  Took a flexeril last pm woke up very sleepy and having a headache  Describes headache as throbbing

## 2018-01-08 ENCOUNTER — Encounter: Payer: Self-pay | Admitting: Emergency Medicine

## 2018-01-08 ENCOUNTER — Other Ambulatory Visit: Payer: Self-pay

## 2018-01-08 ENCOUNTER — Emergency Department
Admission: EM | Admit: 2018-01-08 | Discharge: 2018-01-08 | Disposition: A | Discharge status: Home / Self Care | Payer: Self-pay | Attending: Emergency Medicine | Admitting: Emergency Medicine

## 2018-01-08 DIAGNOSIS — Z79899 Other long term (current) drug therapy: Secondary | ICD-10-CM | POA: Insufficient documentation

## 2018-01-08 DIAGNOSIS — N390 Urinary tract infection, site not specified: Secondary | ICD-10-CM | POA: Insufficient documentation

## 2018-01-08 DIAGNOSIS — F17210 Nicotine dependence, cigarettes, uncomplicated: Secondary | ICD-10-CM | POA: Insufficient documentation

## 2018-01-08 LAB — URINALYSIS, COMPLETE (UACMP) WITH MICROSCOPIC
BACTERIA UA: NONE SEEN
Bilirubin Urine: NEGATIVE
GLUCOSE, UA: NEGATIVE mg/dL
HGB URINE DIPSTICK: NEGATIVE
KETONES UR: NEGATIVE mg/dL
LEUKOCYTES UA: NEGATIVE
Nitrite: POSITIVE — AB
PROTEIN: NEGATIVE mg/dL
Specific Gravity, Urine: 1.026 (ref 1.005–1.030)
pH: 5 (ref 5.0–8.0)

## 2018-01-08 LAB — CHLAMYDIA/NGC RT PCR (ARMC ONLY)
Chlamydia Tr: NOT DETECTED
N gonorrhoeae: NOT DETECTED

## 2018-01-08 MED ORDER — CEPHALEXIN 500 MG PO CAPS
500.0000 mg | ORAL_CAPSULE | Freq: Once | ORAL | Status: AC
  Administered 2018-01-08: 500 mg via ORAL
  Filled 2018-01-08: qty 1

## 2018-01-08 MED ORDER — CEPHALEXIN 500 MG PO CAPS: 500.0000 mg | ORAL_CAPSULE | Freq: Four times a day (QID) | ORAL | 0 refills | Status: DC

## 2018-01-08 MED ORDER — CEFTRIAXONE SODIUM 1 G IJ SOLR
1.0000 g | Freq: Once | INTRAMUSCULAR | Status: AC
  Administered 2018-01-08: 1 g via INTRAMUSCULAR
  Filled 2018-01-08: qty 10

## 2018-01-08 NOTE — ED Provider Notes (Signed)
[facility] Emergency Department Provider Note  ____________________________________________  Time seen: Approximately 7:34 PM  I have reviewed the triage vital signs and the nursing notes.   HISTORY  Chief Complaint Urinary Tract Infection    HPI Curtis Edwards is a 22 y.o. male who presents emergency department with urinary urgency and frequency.  Patient reports that over the past 2 days he has had drastic increased urgency and frequency.  Patient reports that he is having less output per urination.  No dysuria.  Patient reports that he has had a UTI in the past.  He is not concerned for STDs.  He has had kidney stones in the past but states that the symptoms are not consistent with previous kidney stones.  Patient is concerned that he has a UTI.  No fevers or chills, domino pain, nausea or vomiting.    History reviewed. No pertinent past medical history.  There are no active problems to display for this patient.   History reviewed. No pertinent surgical history.  Prior to Admission medications   Medication Sig Start Date End Date Taking? Authorizing Provider  butalbital-acetaminophen-caffeine (FIORICET) 50-325-40 MG tablet Take 1-2 tablets by mouth every 6 (six) hours as needed for headache. 08/22/15   Beers, Charmayne Sheerharles M, PA-C  cephALEXin (KEFLEX) 500 MG capsule Take 1 capsule (500 mg total) by mouth 4 (four) times daily. 01/08/18   Mackinze Criado, Delorise RoyalsJonathan D, PA-C  cyclobenzaprine (FLEXERIL) 10 MG tablet Take 1 tablet (10 mg total) by mouth 3 (three) times daily as needed for muscle spasms. 07/22/15   Hagler, Jami L, PA-C  naproxen (NAPROSYN) 500 MG tablet Take 1 tablet (500 mg total) by mouth 2 (two) times daily with a meal. 07/19/15   Beers, Charmayne Sheerharles M, PA-C    Allergies Patient has no known allergies.  No family history on file.  Social History Social History   Tobacco Use  . Smoking status: Current Every Day Smoker    Packs/day: 0.50    Types:  Cigarettes  . Smokeless tobacco: Never Used  Substance Use Topics  . Alcohol use: No  . Drug use: No     Review of Systems  Constitutional: No fever/chills Eyes: No visual changes.  Cardiovascular: no chest pain. Respiratory: no cough. No SOB. Gastrointestinal: No abdominal pain.  No nausea, no vomiting.  No diarrhea.  No constipation. Genitourinary: Negative for dysuria. No hematuria.  Positive for urinary frequency and urgency. Musculoskeletal: Negative for musculoskeletal pain. Skin: Negative for rash, abrasions, lacerations, ecchymosis. Neurological: Negative for headaches, focal weakness or numbness. 10-point ROS otherwise negative.  ____________________________________________   PHYSICAL EXAM:  VITAL SIGNS: ED Triage Vitals [01/08/18 1742]  Enc Vitals Group     BP (!) 120/92     Pulse Rate 95     Resp 16     Temp 98.7 F (37.1 C)     Temp Source Oral     SpO2 100 %     Weight 115 lb (52.2 kg)     Height 5\' 9"  (1.753 m)     Head Circumference      Peak Flow      Pain Score 0     Pain Loc      Pain Edu?      Excl. in GC?      Constitutional: Alert and oriented. Well appearing and in no acute distress. Eyes: Conjunctivae are normal. PERRL. EOMI. Head: Atraumatic. Neck: No stridor.    Cardiovascular: Normal rate, regular rhythm. Normal S1  and S2.  Good peripheral circulation. Respiratory: Normal respiratory effort without tachypnea or retractions. Lungs CTAB. Good air entry to the bases with no decreased or absent breath sounds. Gastrointestinal: Bowel sounds 4 quadrants. Soft and nontender to palpation. No guarding or rigidity. No palpable masses. No distention. No CVA tenderness. Musculoskeletal: Full range of motion to all extremities. No gross deformities appreciated. Neurologic:  Normal speech and language. No gross focal neurologic deficits are appreciated.  Skin:  Skin is warm, dry and intact. No rash noted. Psychiatric: Mood and affect are normal.  Speech and behavior are normal. Patient exhibits appropriate insight and judgement.   ____________________________________________   LABS (all labs ordered are listed, but only abnormal results are displayed)  Labs Reviewed  URINALYSIS, COMPLETE (UACMP) WITH MICROSCOPIC - Abnormal; Notable for the following components:      Result Value   Color, Urine AMBER (*)    APPearance CLEAR (*)    Nitrite POSITIVE (*)    All other components within normal limits  CHLAMYDIA/NGC RT PCR ([facility] ONLY)   ____________________________________________  EKG   ____________________________________________  RADIOLOGY   No results found.  ____________________________________________    PROCEDURES  Procedure(s) performed:    Procedures    Medications  cefTRIAXone (ROCEPHIN) injection 1 g (has no administration in time range)  cephALEXin (KEFLEX) capsule 500 mg (has no administration in time range)     ____________________________________________   INITIAL IMPRESSION / ASSESSMENT AND PLAN / ED COURSE  Pertinent labs & imaging results that were available during my care of the patient were reviewed by me and considered in my medical decision making (see chart for details).  Review of the [place] CSRS was performed in accordance of the [facility] prior to dispensing any controlled drugs.      Patient's diagnosis is consistent with UTI in a male patient.  Patient presents the emergency department with urinary frequency and urgency.  Urinalysis returns with findings consistent with UTI.  Patient will be given Rocephin and first dose of Keflex in the emergency department.  I will prescribe Keflex for the patient.  Tylenol and Motrin at home as needed.  Drink plenty of fluids.  Follow-up primary care as needed. Patient is given ED precautions to return to the ED for any worsening or new symptoms.     ____________________________________________  FINAL CLINICAL IMPRESSION(S) / ED  DIAGNOSES  Final diagnoses:  Lower urinary tract infectious disease      NEW MEDICATIONS STARTED DURING THIS VISIT:  ED Discharge Orders         Ordered    cephALEXin (KEFLEX) 500 MG capsule  4 times daily     01/08/18 2019              This chart was dictated using voice recognition software/Dragon. Despite best efforts to proofread, errors can occur which can change the meaning. Any change was purely unintentional.    Racheal Patches, PA-C 01/08/18 2022    Nita Sickle, MD 01/09/18 [phone]

## 2018-01-08 NOTE — ED Triage Notes (Signed)
Pt in via POV, reports urinary frequency x 2 days, reports feeling as if he needs to go but unable to go.  Pt denies any pain.  Ambulatory to triage, NAD noted at this time.

## 2020-05-26 ENCOUNTER — Encounter: Payer: Self-pay | Admitting: Emergency Medicine

## 2020-05-26 ENCOUNTER — Emergency Department
Admission: EM | Admit: 2020-05-26 | Discharge: 2020-05-26 | Disposition: A | Discharge status: Home / Self Care | Payer: Self-pay | Attending: Emergency Medicine | Admitting: Emergency Medicine

## 2020-05-26 ENCOUNTER — Other Ambulatory Visit: Payer: Self-pay

## 2020-05-26 DIAGNOSIS — R519 Headache, unspecified: Secondary | ICD-10-CM | POA: Insufficient documentation

## 2020-05-26 DIAGNOSIS — K0889 Other specified disorders of teeth and supporting structures: Secondary | ICD-10-CM | POA: Insufficient documentation

## 2020-05-26 DIAGNOSIS — F17210 Nicotine dependence, cigarettes, uncomplicated: Secondary | ICD-10-CM | POA: Insufficient documentation

## 2020-05-26 MED ORDER — KETOROLAC TROMETHAMINE 30 MG/ML IJ SOLN
INTRAMUSCULAR | Status: AC
  Filled 2020-05-26: qty 1

## 2020-05-26 MED ORDER — KETOROLAC TROMETHAMINE 30 MG/ML IJ SOLN
30.0000 mg | Freq: Once | INTRAMUSCULAR | Status: AC
  Administered 2020-05-26: 30 mg via INTRAMUSCULAR
  Filled 2020-05-26: qty 1

## 2020-05-26 MED ORDER — LIDOCAINE VISCOUS HCL 2 % MT SOLN
15.0000 mL | Freq: Once | OROMUCOSAL | Status: AC
  Administered 2020-05-26: 15 mL via OROMUCOSAL
  Filled 2020-05-26: qty 15

## 2020-05-26 MED ORDER — OXYCODONE-ACETAMINOPHEN 5-325 MG PO TABS
1.0000 | ORAL_TABLET | Freq: Once | ORAL | Status: AC
  Administered 2020-05-26: 1 via ORAL
  Filled 2020-05-26: qty 1

## 2020-05-26 MED ORDER — MAGIC MOUTHWASH W/LIDOCAINE: 5.0000 mL | Freq: Four times a day (QID) | ORAL | 0 refills | Status: DC | PRN

## 2020-05-26 MED ORDER — AMOXICILLIN-POT CLAVULANATE 875-125 MG PO TABS
1.0000 | ORAL_TABLET | Freq: Once | ORAL | Status: AC
  Administered 2020-05-26: 1 via ORAL
  Filled 2020-05-26: qty 1

## 2020-05-26 MED ORDER — AMOXICILLIN-POT CLAVULANATE 875-125 MG PO TABS
1.0000 | ORAL_TABLET | Freq: Two times a day (BID) | ORAL | 0 refills | Status: AC
Start: 2020-05-26 — End: 2020-06-05

## 2020-05-26 NOTE — ED Provider Notes (Signed)
[facility] Emergency Department Provider Note  ____________________________________________   Event Date/Time   First MD Initiated Contact with Patient 05/26/20 [id number]     (approximate)  I have reviewed the triage vital signs and the nursing notes.   HISTORY  Chief Complaint Facial Pain    HPI Curtis Edwards is a 25 y.o. male who presents for evaluation of left-sided facial pain.  He said that he has had problems in that area before and his grandfather, who is a Education officer, community, has put in a filling in some of his left upper back teeth in the past.  However the filling subsequently came out.  He knows that he has some issues with his teeth but he has not gone to see a dentist.  He does not think he grinds his teeth.  He has no pain higher up near his ear (TMJ) but the left side of the face hurts.  It is not swollen and he is not having any trouble breathing or swallowing.  He tried some Tylenol which did not help.  He has some sensitivity to heat and cold but not as much as he used to.         History reviewed. No pertinent past medical history.  There are no problems to display for this patient.   History reviewed. No pertinent surgical history.  Prior to Admission medications   Medication Sig Start Date End Date Taking? Authorizing Provider  amoxicillin-clavulanate (AUGMENTIN) 875-125 MG tablet Take 1 tablet by mouth every 12 (twelve) hours for 10 days. 05/26/20 06/05/20 Yes Loleta Rose, MD  magic mouthwash w/lidocaine SOLN Take 5 mLs by mouth 4 (four) times daily as needed for mouth pain. Swish and spit, do not swallow the solution. 05/26/20  Yes Loleta Rose, MD    Allergies Patient has no known allergies.  No family history on file.  Social History Social History   Tobacco Use  . Smoking status: Current Every Day Smoker    Packs/day: 0.50    Types: Cigarettes  . Smokeless tobacco: Never Used  Vaping Use  . Vaping Use: Never used  Substance Use  Topics  . Alcohol use: No  . Drug use: No    Review of Systems Constitutional: No fever/chills Eyes: No visual changes. ENT: Left upper rear dental pain/facial pain. Cardiovascular: Denies chest pain. Respiratory: Denies shortness of breath. Gastrointestinal: No abdominal pain.  No nausea, no vomiting.   Neurological: Negative for headaches, focal weakness or numbness.   ____________________________________________   PHYSICAL EXAM:  VITAL SIGNS: ED Triage Vitals  Enc Vitals Group     BP 05/26/20 0549 (!) 142/68     Pulse Rate 05/26/20 0549 71     Resp 05/26/20 0549 20     Temp 05/26/20 0549 98.5 F (36.9 C)     Temp Source 05/26/20 0549 Oral     SpO2 05/26/20 0549 98 %     Weight 05/26/20 0544 63.5 kg (140 lb)     Height 05/26/20 0544 1.753 m (5\' 9" )     Head Circumference --      Peak Flow --      Pain Score 05/26/20 0544 9     Pain Loc --      Pain Edu? --      Excl. in GC? --     Constitutional: Alert and oriented.  Eyes: Conjunctivae are normal.  Head: Atraumatic. Nose: No congestion/rhinnorhea. Mouth/Throat: No evidence of acute oropharyngeal abnormality such as Ludwig's angina,  peritonsillar abscess, angioedema, etc.  No submandibular swelling.  No tenderness to palpation of the left TMJ.  Patient has point tenderness that corresponds with his two left rear molars.  There is no significant swelling but there are multiple chronic appearing dental fractures as well as some chronic decay/caries.  No drainable abscesses are visible. Neck: No stridor.  No meningeal signs.  No submandibular induration.  No cervical lymphadenopathy. Cardiovascular: Normal rate, regular rhythm. Good peripheral circulation. Neurologic:  Normal speech and language. No gross focal neurologic deficits are appreciated.  Skin:  Skin is warm, dry and intact. Psychiatric: Mood and affect are normal. Speech and behavior are normal.  ____________________________________________    INITIAL  IMPRESSION / MDM / ASSESSMENT AND PLAN / ED COURSE  As part of my medical decision making, I reviewed the following data within the electronic MEDICAL RECORD NUMBER Nursing notes reviewed and incorporated and Notes from prior ED visits and reviewed [facility] Controlled Substance Database.  Patient has some chronic dental fractures and likely superficial infection leading to the left-sided facial pain.  Unlikely trigeminal neuralgia nor TMJ. No evidence of serious bacterial infection or airway compromise.  Starting patient on Augmentin, provided Toradol 30 mg intramuscular and viscous lidocaine mouth rinse and 1 Percocet.  Discharge prescriptions as listed below.  I stressed to the patient the importance of close follow-up with a dental provider and I provided the dental resource guide.  He says he understands and agrees with the plan.  ____________________________________________  FINAL CLINICAL IMPRESSION(S) / ED DIAGNOSES  Final diagnoses:  Left facial pain  Pain, dental     MEDICATIONS GIVEN DURING THIS VISIT:  Medications  amoxicillin-clavulanate (AUGMENTIN) 875-125 MG per tablet 1 tablet (1 tablet Oral Given 05/26/20 0648)  ketorolac (TORADOL) 30 MG/ML injection 30 mg (30 mg Intramuscular Given 05/26/20 0647)  lidocaine (XYLOCAINE) 2 % viscous mouth solution 15 mL (15 mLs Mouth/Throat Given 05/26/20 0648)  oxyCODONE-acetaminophen (PERCOCET/ROXICET) 5-325 MG per tablet 1 tablet (1 tablet Oral Given 05/26/20 1062)     ED Discharge Orders         Ordered    magic mouthwash w/lidocaine SOLN  4 times daily PRN       Note to Pharmacy: Please mix viscous lidocaine 2% with magic mouthwash solution so that the lidocaine comprises approximately 25 % of the total solution.   05/26/20 0638    amoxicillin-clavulanate (AUGMENTIN) 875-125 MG tablet  Every 12 hours        05/26/20 6948          *Please note:  Curtis Edwards was evaluated in Emergency Department on 05/26/2020 for the symptoms  described in the history of present illness. He was evaluated in the context of the global COVID-19 pandemic, which necessitated consideration that the patient might be at risk for infection with the SARS-CoV-2 virus that causes COVID-19. Institutional protocols and algorithms that pertain to the evaluation of patients at risk for COVID-19 are in a state of rapid change based on information released by regulatory bodies including the CDC and federal and state organizations. These policies and algorithms were followed during the patient's care in the ED.  Some ED evaluations and interventions may be delayed as a result of limited staffing during and after the pandemic.*  Note:  This document was prepared using Dragon voice recognition software and may include unintentional dictation errors.   Loleta Rose, MD 05/26/20 [phone]

## 2020-05-26 NOTE — Discharge Instructions (Addendum)

## 2020-05-26 NOTE — ED Triage Notes (Signed)
Patient ambulatory to triage with steady gait, without difficulty or distress noted; pt reports left sided facial pain since last night; st unsure if it is dental or r/t allergies; tylenol taken at 3am without relief

## 2020-07-26 ENCOUNTER — Other Ambulatory Visit: Payer: Self-pay

## 2020-07-26 ENCOUNTER — Emergency Department
Admission: EM | Admit: 2020-07-26 | Discharge: 2020-07-26 | Disposition: A | Discharge status: Home / Self Care | Payer: Self-pay | Attending: Emergency Medicine | Admitting: Emergency Medicine

## 2020-07-26 DIAGNOSIS — B9789 Other viral agents as the cause of diseases classified elsewhere: Secondary | ICD-10-CM

## 2020-07-26 DIAGNOSIS — F17210 Nicotine dependence, cigarettes, uncomplicated: Secondary | ICD-10-CM | POA: Insufficient documentation

## 2020-07-26 DIAGNOSIS — J069 Acute upper respiratory infection, unspecified: Secondary | ICD-10-CM | POA: Insufficient documentation

## 2020-07-26 DIAGNOSIS — Z28310 Unvaccinated for covid-19: Secondary | ICD-10-CM | POA: Insufficient documentation

## 2020-07-26 DIAGNOSIS — J029 Acute pharyngitis, unspecified: Secondary | ICD-10-CM | POA: Insufficient documentation

## 2020-07-26 DIAGNOSIS — Z20822 Contact with and (suspected) exposure to covid-19: Secondary | ICD-10-CM | POA: Insufficient documentation

## 2020-07-26 DIAGNOSIS — J028 Acute pharyngitis due to other specified organisms: Secondary | ICD-10-CM

## 2020-07-26 LAB — SARS CORONAVIRUS 2 (TAT 6-24 HRS): SARS Coronavirus 2: NEGATIVE

## 2020-07-26 LAB — GROUP A STREP BY PCR: Group A Strep by PCR: NOT DETECTED

## 2020-07-26 MED ORDER — IBUPROFEN 800 MG PO TABS: 800.0000 mg | ORAL_TABLET | Freq: Three times a day (TID) | ORAL | 0 refills | Status: DC | PRN

## 2020-07-26 MED ORDER — LIDOCAINE VISCOUS HCL 2 % MT SOLN: 5.0000 mL | Freq: Four times a day (QID) | OROMUCOSAL | 0 refills | Status: DC | PRN

## 2020-07-26 MED ORDER — PSEUDOEPH-BROMPHEN-DM 30-2-10 MG/5ML PO SYRP: 5.0000 mL | ORAL_SOLUTION | Freq: Four times a day (QID) | ORAL | 0 refills | Status: DC | PRN

## 2020-07-26 NOTE — ED Notes (Signed)
See triage note  Presents with subjective last pm sore throat and body aches  States he did have recent COVID exposure

## 2020-07-26 NOTE — ED Triage Notes (Signed)
Pt c/o sore throat, cough, body aches that started yesterday with recent exposure to covid

## 2020-07-26 NOTE — Discharge Instructions (Signed)
Your rapid strep test was negative.  Your COVID-19 test results are pending.  It may be found later today in the MyChart app.  Read and follow discharge care instruction take medication as directed.

## 2020-07-26 NOTE — ED Provider Notes (Signed)
[facility] Emergency Department Provider Note   ____________________________________________   Event Date/Time   First MD Initiated Contact with Patient 07/26/20 1123     (approximate)  I have reviewed the triage vital signs and the nursing notes.   HISTORY  Chief Complaint URI    HPI Curtis Edwards is a 25 y.o. male patient complain of sore throat, cough, and body aches that started yesterday.  Patient stated recent exposure to COVID-19 by family members.  Patient has not taken the vaccine.  Patient denies pain.         History reviewed. No pertinent past medical history.  There are no problems to display for this patient.   History reviewed. No pertinent surgical history.  Prior to Admission medications   Medication Sig Start Date End Date Taking? Authorizing Provider  brompheniramine-pseudoephedrine-DM 30-2-10 MG/5ML syrup Take 5 mLs by mouth 4 (four) times daily as needed. Mix with 5 mL of viscous lidocaine for swish and swallow 07/26/20  Yes Joni Reining, PA-C  ibuprofen (ADVIL) 800 MG tablet Take 1 tablet (800 mg total) by mouth every 8 (eight) hours as needed for moderate pain. 07/26/20  Yes Joni Reining, PA-C  lidocaine (XYLOCAINE) 2 % solution Use as directed 5 mLs in the mouth or throat every 6 (six) hours as needed for mouth pain. Mix with 5 mL with Bromfed-DM for swish and swallow 07/26/20  Yes Joni Reining, PA-C    Allergies Patient has no known allergies.  No family history on file.  Social History Social History   Tobacco Use   Smoking status: Every Day    Packs/day: 0.50    Pack years: 0.00    Types: Cigarettes   Smokeless tobacco: Never  Vaping Use   Vaping Use: Never used  Substance Use Topics   Alcohol use: No   Drug use: No    Review of Systems  Constitutional: No fever/chills.  Body aches. Eyes: No visual changes. ENT: Sore throat.   Cardiovascular: Denies chest pain. Respiratory: Denies shortness  of breath.  Nonproductive cough. Gastrointestinal: No abdominal pain.  No nausea, no vomiting.  No diarrhea.  No constipation. Genitourinary: Negative for dysuria. Musculoskeletal: Negative for back pain. Skin: Negative for rash. Neurological: Negative for headaches, focal weakness or numbness.  ____________________________________________   PHYSICAL EXAM:  VITAL SIGNS: ED Triage Vitals  Enc Vitals Group     BP 07/26/20 1053 124/89     Pulse Rate 07/26/20 1053 91     Resp 07/26/20 1053 17     Temp 07/26/20 1053 98.9 F (37.2 C)     Temp Source 07/26/20 1053 Oral     SpO2 07/26/20 1053 99 %     Weight 07/26/20 1054 140 lb (63.5 kg)     Height 07/26/20 1054 5\' 9"  (1.753 m)     Head Circumference --      Peak Flow --      Pain Score 07/26/20 1054 0     Pain Loc --      Pain Edu? --      Excl. in GC? --     Constitutional: Alert and oriented. Well appearing and in no acute distress. Eyes: Conjunctivae are normal. PERRL. EOMI. Head: Atraumatic. Nose: No congestion/rhinnorhea. Mouth/Throat: Mucous membranes are moist.  Oropharynx non-erythematous. Neck: No stridor.   Hematological/Lymphatic/Immunilogical: No cervical lymphadenopathy. Cardiovascular: Normal rate, regular rhythm. Grossly normal heart sounds.  Good peripheral circulation. Respiratory: Normal respiratory effort.  No retractions. Lungs  CTAB. Gastrointestinal: Soft and nontender. No distention. No abdominal bruits. No CVA tenderness. Genitourinary: Deferred Neurologic:  Normal speech and language. No gross focal neurologic deficits are appreciated. No gait instability. Skin:  Skin is warm, dry and intact. No rash noted. Psychiatric: Mood and affect are normal. Speech and behavior are normal.  ____________________________________________   LABS (all labs ordered are listed, but only abnormal results are displayed)  Labs Reviewed  GROUP A STREP BY PCR  SARS CORONAVIRUS 2 (TAT 6-24 HRS)    ____________________________________________  EKG   ____________________________________________  RADIOLOGY Margarite Gouge, personally viewed and evaluated these images (plain radiographs) as part of my medical decision making, as well as reviewing the written report by the radiologist.  ED MD interpretation:    Official radiology report(s): No results found.  ____________________________________________   PROCEDURES  Procedure(s) performed (including Critical Care):  Procedures   ____________________________________________   INITIAL IMPRESSION / ASSESSMENT AND PLAN / ED COURSE  As part of my medical decision making, I reviewed the following data within the electronic MEDICAL RECORD NUMBER         Patient presents for cute onset of sore throat, cough, body aches that started yesterday.  Positive exposure to COVID-19 by close family members.  Advised patient that rapid strep test was negative.  COVID-19 test results are pending.  Results can be found later today in the MyChart app.  Patient complaint and physical exam consistent with viral infection.  Patient given discharge care instruction advised take medication as directed.  Patient advised COVID-19 test is positive must quarantine per CDC recommendations.      ____________________________________________   FINAL CLINICAL IMPRESSION(S) / ED DIAGNOSES  Final diagnoses:  Viral URI with cough  Sore throat (viral)     ED Discharge Orders          Ordered    lidocaine (XYLOCAINE) 2 % solution  Every 6 hours PRN        07/26/20 1235    brompheniramine-pseudoephedrine-DM 30-2-10 MG/5ML syrup  4 times daily PRN        07/26/20 1235    ibuprofen (ADVIL) 800 MG tablet  Every 8 hours PRN        07/26/20 1235             Note:  This document was prepared using Dragon voice recognition software and may include unintentional dictation errors.    Joni Reining, PA-C 07/26/20 1238    Minna Antis, MD 07/26/20 1515

## 2020-08-02 ENCOUNTER — Other Ambulatory Visit: Payer: Self-pay

## 2020-08-02 ENCOUNTER — Emergency Department
Admission: EM | Admit: 2020-08-02 | Discharge: 2020-08-02 | Disposition: A | Discharge status: Home / Self Care | Payer: Self-pay | Attending: Emergency Medicine | Admitting: Emergency Medicine

## 2020-08-02 DIAGNOSIS — Z5321 Procedure and treatment not carried out due to patient leaving prior to being seen by health care provider: Secondary | ICD-10-CM | POA: Insufficient documentation

## 2020-08-02 DIAGNOSIS — J069 Acute upper respiratory infection, unspecified: Secondary | ICD-10-CM | POA: Insufficient documentation

## 2020-08-02 NOTE — ED Notes (Signed)
Pt approached this RN, states that his mother told him to stop taking cough medicine and he feels like that will help, no longer wishes to wait to be seen. Pt advised to return for worsening sx's

## 2020-08-02 NOTE — ED Triage Notes (Signed)
Pt to ED for meds to "help get over cold" pt states he was not feeling well after taking cough syrup that he was instructed to take when came to ER and dx for URI, states it had him "wigging out" and does not want to take any more Pt in NAD, ambulatory, RR even and unlabored

## 2020-08-04 ENCOUNTER — Other Ambulatory Visit: Payer: Self-pay

## 2020-08-04 ENCOUNTER — Emergency Department
Admission: EM | Admit: 2020-08-04 | Discharge: 2020-08-04 | Disposition: A | Discharge status: Home / Self Care | Payer: Self-pay | Attending: Emergency Medicine | Admitting: Emergency Medicine

## 2020-08-04 DIAGNOSIS — H9203 Otalgia, bilateral: Secondary | ICD-10-CM | POA: Insufficient documentation

## 2020-08-04 DIAGNOSIS — F17210 Nicotine dependence, cigarettes, uncomplicated: Secondary | ICD-10-CM | POA: Insufficient documentation

## 2020-08-04 DIAGNOSIS — J069 Acute upper respiratory infection, unspecified: Secondary | ICD-10-CM | POA: Insufficient documentation

## 2020-08-04 MED ORDER — IBUPROFEN 800 MG PO TABS
800.0000 mg | ORAL_TABLET | Freq: Once | ORAL | Status: AC
  Administered 2020-08-04: 800 mg via ORAL
  Filled 2020-08-04: qty 1

## 2020-08-04 NOTE — ED Notes (Signed)
ED Provider at bedside. 

## 2020-08-04 NOTE — Discharge Instructions (Addendum)
You may alternate Tylenol 1000 mg every 6 hours as needed for pain, fever and Ibuprofen 800 mg every 8 hours as needed for pain, fever.  Please take Ibuprofen with food.  Do not take more than 4000 mg of Tylenol (acetaminophen) in a 24 hour period.  You may use over-the-counter guaifenesin, dextromethorphan as needed for cough and chest congestion.  You may use over-the-counter phenylephrine or pseudoephedrine as needed for nasal congestion.  These medications may also help with the ear pain.  You may use over-the-counter Zyrtec in the morning and Benadryl at night to help dry out any fluid in your ears that can also help with ear pain.  You do not have any signs of a bacterial infection today and do not need to be started on antibiotics.   Steps to find a Primary Care Provider (PCP):  Call [phone] or [phone] to access "[place] Find a Doctor Service."  2.  You may also go on the [facility] website at [email or web address]

## 2020-08-04 NOTE — ED Triage Notes (Signed)
Pt ambulatory to triage c/o bilateral ear pain. Pt reports cough x 1 week. Ear pain started yesterday. NAD noted at this time

## 2020-08-04 NOTE — ED Provider Notes (Signed)
[facility] Emergency Department Provider Note  ____________________________________________   Event Date/Time   First MD Initiated Contact with Patient 08/04/20 [phone]     (approximate)  I have reviewed the triage vital signs and the nursing notes.   HISTORY  Chief Complaint Otalgia    HPI Curtis Edwards is a 25 y.o. male with no significant past medical history who presents to the emergency department with 9 days of fever, cough, congestion, bilateral ear pain, sore throat, nausea and vomiting.  Was seen in the emergency department when symptoms started on 07/26/2020 and was negative for COVID-19 and strep throat.  States he has not had a fever in 8 days.  No longer having any vomiting.  No diarrhea.  States he is still having ear pain, sore throat and a dry, nonproductive cough.  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  He is not sure what medications he can take over-the-counter.        History reviewed. No pertinent past medical history.  There are no problems to display for this patient.   History reviewed. No pertinent surgical history.  Prior to Admission medications   Medication Sig Start Date End Date Taking? Authorizing Provider  brompheniramine-pseudoephedrine-DM 30-2-10 MG/5ML syrup Take 5 mLs by mouth 4 (four) times daily as needed. Mix with 5 mL of viscous lidocaine for swish and swallow 07/26/20   Joni Reining, PA-C  ibuprofen (ADVIL) 800 MG tablet Take 1 tablet (800 mg total) by mouth every 8 (eight) hours as needed for moderate pain. 07/26/20   Joni Reining, PA-C  lidocaine (XYLOCAINE) 2 % solution Use as directed 5 mLs in the mouth or throat every 6 (six) hours as needed for mouth pain. Mix with 5 mL with Bromfed-DM for swish and swallow 07/26/20   Joni Reining, PA-C    Allergies Patient has no known allergies.  History reviewed. No pertinent family history.  Social History Social History    Tobacco Use   Smoking status: Every Day    Packs/day: 0.50    Pack years: 0.00    Types: Cigarettes   Smokeless tobacco: Never  Vaping Use   Vaping Use: Never used  Substance Use Topics   Alcohol use: No   Drug use: No    Review of Systems Constitutional: No fever. Eyes: No visual changes. ENT: No sore throat. Cardiovascular: Denies chest pain. Respiratory: Denies shortness of breath. Gastrointestinal: No nausea, vomiting, diarrhea. Genitourinary: Negative for dysuria. Musculoskeletal: Negative for back pain. Skin: Negative for rash. Neurological: Negative for focal weakness or numbness.  ____________________________________________   PHYSICAL EXAM:  VITAL SIGNS: ED Triage Vitals  Enc Vitals Group     BP 08/04/20 0603 (!) 124/103     Pulse Rate 08/04/20 0603 76     Resp 08/04/20 0603 18     Temp 08/04/20 0603 98.2 F (36.8 C)     Temp Source 08/04/20 0603 Oral     SpO2 08/04/20 0603 100 %     Weight 08/04/20 0603 140 lb (63.5 kg)     Height 08/04/20 0603 5\' 11"  (1.803 m)     Head Circumference --      Peak Flow --      Pain Score 08/04/20 0607 6     Pain Loc --      Pain Edu? --      Excl. in GC? --    CONSTITUTIONAL: Alert and oriented and responds  appropriately to questions. Well-appearing; well-nourished, afebrile, nontoxic, well-hydrated HEAD: Normocephalic EYES: Conjunctivae clear, pupils appear equal, EOM appear intact ENT: normal nose; moist mucous membranes; patient has some mild pharyngeal erythema seen in the posterior oropharynx, no petechiae, no tonsillar hypertrophy or exudate, no uvular deviation, no unilateral swelling, no trismus or drooling, no muffled voice, normal phonation, no stridor, no dental caries present, no drainable dental abscess noted, no Ludwig's angina, tongue sits flat in the bottom of the mouth, no angioedema, no facial erythema or warmth, no facial swelling; no pain with movement of the neck, no cervical LAD.  TMs are clear  bilaterally without erythema, purulence, bulging, perforation, effusion.  No cerumen impaction or sign of foreign body in the external auditory canal. No inflammation, erythema or drainage from the external auditory canal. No signs of mastoiditis. No pain with manipulation of the pinna bilaterally. NECK: Supple, normal ROM, no meningismus CARD: RRR; S1 and S2 appreciated; no murmurs, no clicks, no rubs, no gallops RESP: Normal chest excursion without splinting or tachypnea; breath sounds clear and equal bilaterally; no wheezes, no rhonchi, no rales, no hypoxia or respiratory distress, speaking full sentences ABD/GI: Normal bowel sounds; non-distended; soft, non-tender, no rebound, no guarding, no peritoneal signs, no hepatosplenomegaly BACK: The back appears normal EXT: Normal ROM in all joints; no deformity noted, no edema; no cyanosis SKIN: Normal color for age and race; warm; no rash on exposed skin NEURO: Moves all extremities equally PSYCH: The patient's mood and manner are appropriate.  ____________________________________________   LABS (all labs ordered are listed, but only abnormal results are displayed)  Labs Reviewed - No data to display ____________________________________________  EKG   ____________________________________________  RADIOLOGY I, Yvetta Drotar, personally viewed and evaluated these images (plain radiographs) as part of my medical decision making, as well as reviewing the written report by the radiologist.  ED MD interpretation:    Official radiology report(s): No results found.  ____________________________________________   PROCEDURES  Procedure(s) performed (including Critical Care):  Procedures    ____________________________________________   INITIAL IMPRESSION / ASSESSMENT AND PLAN / ED COURSE  As part of my medical decision making, I reviewed the following data within the electronic MEDICAL RECORD NUMBER Nursing notes reviewed and  incorporated, Old chart reviewed, and Notes from prior ED visits         Patient here with that I suspect is a viral illness.  No signs of acute otitis media, otitis externa, mastoiditis on exam.  Does have some pharyngitis but no tonsillar hypertrophy, exudate.  No signs of tonsillitis, PTA, deep space neck infection.  He recently was COVID and strep negative.  Discussed with patient that even if we were to test him again for COVID or influenza that he would be outside of the treatment window for any prescription medications given he has had symptoms for 9 days.  His lungs are clear to auscultation here and he has no increased work of breathing, respiratory distress or hypoxia.  Low suspicion for pneumonia.  No headache, meningismus.  He is well-appearing, nontoxic.  Doubt bacteremia, sepsis.  Suspect viral URI.  I do not feel he needs antibiotics today.  I have recommended multiple over-the-counter medications that can help with symptom management.  He is requesting a work note stating that working outside in the heat seems to exacerbate his cough.  Will provide with work note for the next couple of days.  Given outpatient PCP follow-up.  I feel he is safe for discharge home.  At this  time, I do not feel there is any life-threatening condition present. I have reviewed, interpreted and discussed all results (EKG, imaging, lab, urine as appropriate) and exam findings with patient/family. I have reviewed nursing notes and appropriate previous records.  I feel the patient is safe to be discharged home without further emergent workup and can continue workup as an outpatient as needed. Discussed usual and customary return precautions. Patient/family verbalize understanding and are comfortable with this plan.  Outpatient follow-up has been provided as needed. All questions have been answered.  ____________________________________________   FINAL CLINICAL IMPRESSION(S) / ED DIAGNOSES  Final diagnoses:   Viral URI with cough     ED Discharge Orders     None       *Please note:  Curtis Edwards was evaluated in Emergency Department on 08/04/2020 for the symptoms described in the history of present illness. He was evaluated in the context of the global COVID-19 pandemic, which necessitated consideration that the patient might be at risk for infection with the SARS-CoV-2 virus that causes COVID-19. Institutional protocols and algorithms that pertain to the evaluation of patients at risk for COVID-19 are in a state of rapid change based on information released by regulatory bodies including the CDC and federal and state organizations. These policies and algorithms were followed during the patient's care in the ED.  Some ED evaluations and interventions may be delayed as a result of limited staffing during and the pandemic.*   Note:  This document was prepared using Dragon voice recognition software and may include unintentional dictation errors.    Edwar Coe, Layla Maw, DO 08/04/20 [phone]

## 2020-08-18 ENCOUNTER — Encounter: Payer: Self-pay | Admitting: Emergency Medicine

## 2020-08-18 ENCOUNTER — Other Ambulatory Visit: Payer: Self-pay

## 2020-08-18 ENCOUNTER — Emergency Department
Admission: EM | Admit: 2020-08-18 | Discharge: 2020-08-18 | Disposition: A | Discharge status: Home / Self Care | Payer: Self-pay | Attending: Emergency Medicine | Admitting: Emergency Medicine

## 2020-08-18 DIAGNOSIS — F17210 Nicotine dependence, cigarettes, uncomplicated: Secondary | ICD-10-CM | POA: Insufficient documentation

## 2020-08-18 DIAGNOSIS — K0889 Other specified disorders of teeth and supporting structures: Secondary | ICD-10-CM | POA: Insufficient documentation

## 2020-08-18 MED ORDER — BUPIVACAINE HCL (PF) 0.5 % IJ SOLN
10.0000 mL | Freq: Once | INTRAMUSCULAR | Status: AC
Start: 2020-08-18 — End: 2020-08-18
  Administered 2020-08-18: 10 mL
  Filled 2020-08-18: qty 30

## 2020-08-18 MED ORDER — AMOXICILLIN 500 MG PO CAPS: 500.0000 mg | ORAL_CAPSULE | Freq: Three times a day (TID) | ORAL | 0 refills | Status: AC

## 2020-08-18 MED ORDER — BUPIVACAINE-EPINEPHRINE (PF) 0.5% -1:200000 IJ SOLN
10.0000 mL | Freq: Once | INTRAMUSCULAR | Status: DC
  Filled 2020-08-18: qty 10

## 2020-08-18 MED ORDER — IBUPROFEN 600 MG PO TABS: 600.0000 mg | ORAL_TABLET | Freq: Three times a day (TID) | ORAL | 0 refills | Status: DC | PRN

## 2020-08-18 NOTE — ED Provider Notes (Signed)
[facility] Emergency Department Provider Note  ____________________________________________   Event Date/Time   First MD Initiated Contact with Patient 08/18/20 [phone]     (approximate)  I have reviewed the triage vital signs and the nursing notes.   HISTORY  Chief Complaint Dental Pain    HPI Curtis Edwards is a 25 y.o. male  here with dental pain. Pt reports 2 days of aching, gnawing, right upper dental pain. Pt has a cracked tooth and reports that it became progressively more painful over the past 24 hours or so. He has been trying oTC meds w/o relief. Denies any fevers, facial swelling. No drainage or foul taste. Trauma to tooth was years ago. No other triggers. No tongue swelling or difficulty swallowing. No fever, chills. Pain worse w/ eating and palpation.        History reviewed. No pertinent past medical history.  There are no problems to display for this patient.   History reviewed. No pertinent surgical history.  Prior to Admission medications   Medication Sig Start Date End Date Taking? Authorizing Provider  amoxicillin (AMOXIL) 500 MG capsule Take 1 capsule (500 mg total) by mouth 3 (three) times daily for 7 days. 08/18/20 08/25/20 Yes Shaune Pollack, MD  ibuprofen (ADVIL) 600 MG tablet Take 1 tablet (600 mg total) by mouth every 8 (eight) hours as needed for moderate pain. 08/18/20  Yes Shaune Pollack, MD  brompheniramine-pseudoephedrine-DM 30-2-10 MG/5ML syrup Take 5 mLs by mouth 4 (four) times daily as needed. Mix with 5 mL of viscous lidocaine for swish and swallow 07/26/20   Joni Reining, PA-C  lidocaine (XYLOCAINE) 2 % solution Use as directed 5 mLs in the mouth or throat every 6 (six) hours as needed for mouth pain. Mix with 5 mL with Bromfed-DM for swish and swallow 07/26/20   Joni Reining, PA-C    Allergies Patient has no known allergies.  No family history on file.  Social History Social History   Tobacco Use   Smoking  status: Every Day    Packs/day: 0.50    Types: Cigarettes   Smokeless tobacco: Never  Vaping Use   Vaping Use: Never used  Substance Use Topics   Alcohol use: No   Drug use: No    Review of Systems  Review of Systems  Constitutional:  Negative for chills and fever.  HENT:  Positive for dental problem. Negative for sore throat.   Respiratory:  Negative for shortness of breath.   Cardiovascular:  Negative for chest pain.  Gastrointestinal:  Negative for abdominal pain.  Genitourinary:  Negative for flank pain.  Musculoskeletal:  Negative for neck pain.  Skin:  Negative for rash and wound.  Allergic/Immunologic: Negative for immunocompromised state.  Neurological:  Negative for weakness and numbness.  Hematological:  Does not bruise/bleed easily.    ____________________________________________  PHYSICAL EXAM:      VITAL SIGNS: ED Triage Vitals  Enc Vitals Group     BP 08/18/20 0546 123/77     Pulse Rate 08/18/20 0546 94     Resp 08/18/20 0546 18     Temp 08/18/20 0546 98.8 F (37.1 C)     Temp Source 08/18/20 0546 Oral     SpO2 08/18/20 0546 100 %     Weight 08/18/20 0545 130 lb (59 kg)     Height 08/18/20 0545 5\' 9"  (1.753 m)     Head Circumference --      Peak Flow --  Pain Score 08/18/20 0545 9     Pain Loc --      Pain Edu? --      Excl. in GC? --      Physical Exam Vitals and nursing note reviewed.  Constitutional:      General: He is not in acute distress.    Appearance: He is well-developed.  HENT:     Head: Normocephalic and atraumatic.     Comments: Markedly poor dentition with multiple diffuse caries. Right upper premolar with exposed, decayed root. Moderate surrounding gingival edema without apparent abscess. Eyes:     Conjunctiva/sclera: Conjunctivae normal.  Cardiovascular:     Rate and Rhythm: Normal rate and regular rhythm.     Heart sounds: Normal heart sounds.  Pulmonary:     Effort: Pulmonary effort is normal. No respiratory distress.      Breath sounds: No wheezing.  Abdominal:     General: There is no distension.  Musculoskeletal:     Cervical back: Neck supple.  Skin:    General: Skin is warm.     Capillary Refill: Capillary refill takes less than 2 seconds.     Findings: No rash.  Neurological:     Mental Status: He is alert and oriented to person, place, and time.     Motor: No abnormal muscle tone.      ____________________________________________   LABS (all labs ordered are listed, but only abnormal results are displayed)  Labs Reviewed - No data to display  ____________________________________________  EKG:  ________________________________________  RADIOLOGY All imaging, including plain films, CT scans, and ultrasounds, independently reviewed by me, and interpretations confirmed via formal radiology reads.  ED MD interpretation:     Official radiology report(s): No results found.  ____________________________________________  PROCEDURES   Procedure(s) performed (including Critical Care):  Dental Block  Date/Time: 08/18/2020 9:53 AM Performed by: Shaune Pollack, MD Authorized by: Shaune Pollack, MD   Consent:    Consent obtained:  Verbal   Consent given by:  Patient   Risks, benefits, and alternatives were discussed: yes     Risks discussed:  Allergic reaction, swelling, unsuccessful block, nerve damage, pain, infection, intravascular injection and hematoma   Alternatives discussed:  Alternative treatment Universal protocol:    Patient identity confirmed:  Verbally with patient Indications:    Indications: dental pain   Location:    Block type:  Supraperiosteal   Supraperiosteal location:  Upper teeth   Upper teeth location:  4/RU 2nd bicuspid Procedure details:    Needle gauge:  27 G   Anesthetic injected:  Bupivacaine 0.5% w/o epi   Injection procedure:  Anatomic landmarks identified, anatomic landmarks palpated, introduced needle, negative aspiration for blood and  incremental injection Post-procedure details:    Outcome:  Anesthesia achieved   Procedure completion:  Tolerated  ____________________________________________  INITIAL IMPRESSION / MDM / ASSESSMENT AND PLAN / ED COURSE  As part of my medical decision making, I reviewed the following data within the electronic MEDICAL RECORD NUMBER Nursing notes reviewed and incorporated, Old chart reviewed, Notes from prior ED visits, and [place] Controlled Substance Database       *GYAN CAMBRE was evaluated in Emergency Department on 08/18/2020 for the symptoms described in the history of present illness. He was evaluated in the context of the global COVID-19 pandemic, which necessitated consideration that the patient might be at risk for infection with the SARS-CoV-2 virus that causes COVID-19. Institutional protocols and algorithms that pertain to the evaluation of patients at  risk for COVID-19 are in a state of rapid change based on information released by regulatory bodies including the CDC and federal and state organizations. These policies and algorithms were followed during the patient's care in the ED.  Some ED evaluations and interventions may be delayed as a result of limited staffing during the pandemic.*     Medical Decision Making:  25 yo M here with R upper dental pain, likely from caries with possible reversible pulpitis. No signs of gingival abscess. Sublingual space soft, no evidence of Ludwig's. No apparent facial abscess or extension to sinuses. Management discussed, dental block performed for analgesia. Tolerated well. Will d/c with ABX, ibuprofen, dental f/u.  ____________________________________________  FINAL CLINICAL IMPRESSION(S) / ED DIAGNOSES  Final diagnoses:  Pain, dental     MEDICATIONS GIVEN DURING THIS VISIT:  Medications  bupivacaine (MARCAINE) 0.5 % injection 10 mL (10 mLs Infiltration Given by Other 08/18/20 0610)     ED Discharge Orders          Ordered    ibuprofen  (ADVIL) 600 MG tablet  Every 8 hours PRN        08/18/20 0624    amoxicillin (AMOXIL) 500 MG capsule  3 times daily        08/18/20 2025             Note:  This document was prepared using Dragon voice recognition software and may include unintentional dictation errors.   Shaune Pollack, MD 08/18/20 [phone]

## 2020-08-18 NOTE — ED Triage Notes (Signed)
Patient ambulatory to triage with steady gait, without difficulty or distress noted; pt reports rt upper dental pain since last night

## 2020-12-22 ENCOUNTER — Emergency Department: Payer: Self-pay

## 2020-12-22 ENCOUNTER — Emergency Department
Admission: EM | Admit: 2020-12-22 | Discharge: 2020-12-22 | Disposition: A | Discharge status: Home / Self Care | Payer: Self-pay | Attending: Emergency Medicine | Admitting: Emergency Medicine

## 2020-12-22 ENCOUNTER — Other Ambulatory Visit: Payer: Self-pay

## 2020-12-22 ENCOUNTER — Encounter: Payer: Self-pay | Admitting: Emergency Medicine

## 2020-12-22 DIAGNOSIS — F17210 Nicotine dependence, cigarettes, uncomplicated: Secondary | ICD-10-CM | POA: Insufficient documentation

## 2020-12-22 DIAGNOSIS — W208XXA Other cause of strike by thrown, projected or falling object, initial encounter: Secondary | ICD-10-CM | POA: Insufficient documentation

## 2020-12-22 DIAGNOSIS — S80811A Abrasion, right lower leg, initial encounter: Secondary | ICD-10-CM | POA: Insufficient documentation
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
## 2020-12-22 NOTE — ED Provider Notes (Signed)
[facility] Emergency Department Provider Note  ____________________________________________  Time seen: Approximately 4:46 PM  I have reviewed the triage vital signs and the nursing notes.   HISTORY  Chief Complaint Laceration    HPI Curtis Edwards is a 25 y.o. male who presents the emergency department complaining of a laceration to the anterior right shin.  Patient states that he had a car up with stand when it apparently fell.  The metal step portion of the car struck his right leg creating a laceration.  Bleeding controlled prior to arrival.  Last tetanus shot less than a year ago.  Patient denies any other injury or complaint at this time.       History reviewed. No pertinent past medical history.  There are no problems to display for this patient.   History reviewed. No pertinent surgical history.  Prior to Admission medications   Not on File    Allergies Tramadol  History reviewed. No pertinent family history.  Social History Social History   Tobacco Use   Smoking status: Every Day    Packs/day: 0.50    Types: Cigarettes   Smokeless tobacco: Never  Vaping Use   Vaping Use: Never used  Substance Use Topics   Alcohol use: No   Drug use: Yes    Types: Marijuana     Review of Systems  Constitutional: No fever/chills Eyes: No visual changes. No discharge ENT: No upper respiratory complaints. Cardiovascular: no chest pain. Respiratory: no cough. No SOB. Gastrointestinal: No abdominal pain.  No nausea, no vomiting.  No diarrhea.  No constipation. Musculoskeletal: Visualization of the right shin reveals a relatively superficial laceration, more of a deep abrasion to the anterior right shin.  This measures approximately 8 cm in length.  No active bleeding.  No foreign body.  This involves the epidermal and dermal layer but no exposed subcutaneous tissue.  Pulses distally intact.  Sensation intact distally. Skin: Negative for rash,  abrasions, lacerations, ecchymosis. Neurological: Negative for headaches, focal weakness or numbness.  10 System ROS otherwise negative.  ____________________________________________   PHYSICAL EXAM:  VITAL SIGNS: ED Triage Vitals  Enc Vitals Group     BP 12/22/20 1448 114/87     Pulse Rate 12/22/20 1448 74     Resp 12/22/20 1448 20     Temp 12/22/20 1448 98.8 F (37.1 C)     Temp Source 12/22/20 1448 Oral     SpO2 12/22/20 1448 97 %     Weight 12/22/20 1449 140 lb (63.5 kg)     Height 12/22/20 1449 5\' 6"  (1.676 m)     Head Circumference --      Peak Flow --      Pain Score 12/22/20 1449 5     Pain Loc --      Pain Edu? --      Excl. in [place]? --      Constitutional: Alert and oriented. Well appearing and in no acute distress. Eyes: Conjunctivae are normal. PERRL. EOMI. Head: Atraumatic. ENT:      Ears:       Nose: No congestion/rhinnorhea.      Mouth/Throat: Mucous membranes are moist.  Neck: No stridor.    Cardiovascular: Normal rate, regular rhythm. Normal S1 and S2.  Good peripheral circulation. Respiratory: Normal respiratory effort without tachypnea or retractions. Lungs CTAB. Good air entry to the bases with no decreased or absent breath sounds. Musculoskeletal: Full range of motion to all extremities. No gross deformities appreciated.  Neurologic:  Normal speech and language. No gross focal neurologic deficits are appreciated.  Skin:  Skin is warm, dry and intact. No rash noted. Psychiatric: Mood and affect are normal. Speech and behavior are normal. Patient exhibits appropriate insight and judgement.   ____________________________________________   LABS (all labs ordered are listed, but only abnormal results are displayed)  Labs Reviewed - No data to display ____________________________________________  EKG   ____________________________________________  RADIOLOGY I personally viewed and evaluated these images as part of my medical decision making, as  well as reviewing the written report by the radiologist.  ED Provider Interpretation: No evidence of acute osseous abnormality to the tibia or fibula  DG Tibia/Fibula Right  Result Date: 12/22/2020 CLINICAL DATA:  Laceration on the right leg. Injury to the right leg while changing a tire. EXAM: RIGHT TIBIA AND FIBULA - 2 VIEW COMPARISON:  None. FINDINGS: There is no evidence of fracture or other focal bone lesions. Soft tissues are unremarkable. IMPRESSION: Negative. Electronically Signed   By: Larose Hires D.O.   On: 12/22/2020 15:49    ____________________________________________    PROCEDURES  Procedure(s) performed:    Procedures    Medications - No data to display   ____________________________________________   INITIAL IMPRESSION / ASSESSMENT AND PLAN / ED COURSE  Pertinent labs & imaging results that were available during my care of the patient were reviewed by me and considered in my medical decision making (see chart for details).  Review of the [place] CSRS was performed in accordance of the [facility] prior to dispensing any controlled drugs.           Patient's diagnosis is consistent with abrasion of the right leg.  Patient presented to the emergency department after a car he was working on fell off a jack stand.  The step of the car struck his leg creating a deep abrasion/superficial laceration.  Visualization of this wound does not require sutures.  Area was cleansed using Surgicel and dressed here in the emergency department.  He was up-to-date on his tetanus immunization.  Patient does not require any other medications at this time.  Follow-up primary care as needed.  Wound care instructions discussed with the patient. Patient is given ED precautions to return to the ED for any worsening or new symptoms.     ____________________________________________  FINAL CLINICAL IMPRESSION(S) / ED DIAGNOSES  Final diagnoses:  Abrasion of anterior right lower leg, initial  encounter      NEW MEDICATIONS STARTED DURING THIS VISIT:  ED Discharge Orders     None           This chart was dictated using voice recognition software/Dragon. Despite best efforts to proofread, errors can occur which can change the meaning. Any change was purely unintentional.    Racheal Patches, PA-C 12/22/20 1702    Gilles Chiquito, MD 12/22/20 [id number]

## 2021-01-18 ENCOUNTER — Other Ambulatory Visit: Payer: Self-pay

## 2021-01-18 ENCOUNTER — Emergency Department
Admission: EM | Admit: 2021-01-18 | Discharge: 2021-01-18 | Disposition: A | Discharge status: Home / Self Care | Payer: Self-pay | Attending: Emergency Medicine | Admitting: Emergency Medicine

## 2021-01-18 ENCOUNTER — Emergency Department: Payer: Self-pay

## 2021-01-18 ENCOUNTER — Encounter: Payer: Self-pay | Admitting: Emergency Medicine

## 2021-01-18 DIAGNOSIS — F17210 Nicotine dependence, cigarettes, uncomplicated: Secondary | ICD-10-CM | POA: Insufficient documentation

## 2021-01-18 DIAGNOSIS — N3001 Acute cystitis with hematuria: Secondary | ICD-10-CM | POA: Insufficient documentation

## 2021-01-18 LAB — BASIC METABOLIC PANEL
Anion gap: 7 (ref 5–15)
BUN: 14 mg/dL (ref 6–20)
CO2: 29 mmol/L (ref 22–32)
Calcium: 9.4 mg/dL (ref 8.9–10.3)
Chloride: 105 mmol/L (ref 98–111)
Creatinine, Ser: 0.92 mg/dL (ref 0.61–1.24)
GFR, Estimated: >60 mL/min (ref >60)
Glucose, Bld: 94 mg/dL (ref 70–99)
Potassium: 3.8 mmol/L (ref 3.5–5.1)
Sodium: 141 mmol/L (ref 135–145)

## 2021-01-18 LAB — URINALYSIS, ROUTINE W REFLEX MICROSCOPIC
Bilirubin Urine: NEGATIVE
Glucose, UA: NEGATIVE mg/dL
Ketones, ur: NEGATIVE mg/dL
Leukocytes,Ua: NEGATIVE
Nitrite: NEGATIVE
Protein, ur: NEGATIVE mg/dL
Specific Gravity, Urine: 1.02 (ref 1.005–1.030)
Squamous Epithelial / LPF: NONE SEEN (ref 0–5)
WBC, UA: NONE SEEN WBC/hpf (ref 0–5)
pH: 7 (ref 5.0–8.0)

## 2021-01-18 LAB — CBC
HCT: 44.5 % (ref 39.0–52.0)
Hemoglobin: 14.7 g/dL (ref 13.0–17.0)
MCH: 29.9 pg (ref 26.0–34.0)
MCHC: 33 g/dL (ref 30.0–36.0)
MCV: 90.6 fL (ref 80.0–100.0)
Platelets: 245 10*3/uL (ref 150–400)
RBC: 4.91 MIL/uL (ref 4.22–5.81)
RDW: 13.1 % (ref 11.5–15.5)
WBC: 10.2 10*3/uL (ref 4.0–10.5)
nRBC: 0 % (ref 0.0–0.2)

## 2021-01-18 MED ORDER — PHENAZOPYRIDINE HCL 200 MG PO TABS: 200.0000 mg | ORAL_TABLET | Freq: Three times a day (TID) | ORAL | 0 refills | Status: DC | PRN

## 2021-01-18 MED ORDER — ONDANSETRON 4 MG PO TBDP: 4.0000 mg | ORAL_TABLET | Freq: Three times a day (TID) | ORAL | 0 refills | Status: DC | PRN

## 2021-01-18 MED ORDER — PHENAZOPYRIDINE HCL 200 MG PO TABS
200.0000 mg | ORAL_TABLET | Freq: Once | ORAL | Status: AC
  Administered 2021-01-18: 22:00:00 200 mg via ORAL
  Filled 2021-01-18: qty 1

## 2021-01-18 MED ORDER — CEPHALEXIN 500 MG PO CAPS: 500.0000 mg | ORAL_CAPSULE | Freq: Three times a day (TID) | ORAL | 0 refills | Status: DC

## 2021-01-18 MED ORDER — KETOROLAC TROMETHAMINE 10 MG PO TABS
10.0000 mg | ORAL_TABLET | Freq: Once | ORAL | Status: AC
  Administered 2021-01-18: 22:00:00 10 mg via ORAL
  Filled 2021-01-18: qty 1

## 2021-01-18 MED ORDER — CEPHALEXIN 500 MG PO CAPS
500.0000 mg | ORAL_CAPSULE | Freq: Once | ORAL | Status: AC
  Administered 2021-01-18: 22:00:00 500 mg via ORAL
  Filled 2021-01-18: qty 1

## 2021-01-18 NOTE — ED Provider Notes (Addendum)
[facility] Emergency Department Provider Note  ____________________________________________  Time seen: Approximately 10:39 PM  I have reviewed the triage vital signs and the nursing notes.   HISTORY  Chief Complaint Urinary Frequency and Flank Pain    HPI Curtis Edwards is a 25 y.o. male with a past history of kidney stones who comes ED complaining of right flank pain radiating to right lower quadrant that started about 2 days ago, waxing and waning, no aggravating or alleviating factors.  No fever chills vomiting.  He has had recent constipation and feels like this was triggered by sitting on the toilet and straining for over 2 hours 1 day.  Reports that currently he is feeling better but still has frequent and urgency.    History reviewed. No pertinent past medical history.   There are no problems to display for this patient.    History reviewed. No pertinent surgical history.   Prior to Admission medications   Medication Sig Start Date End Date Taking? Authorizing Provider  cephALEXin (KEFLEX) 500 MG capsule Take 1 capsule (500 mg total) by mouth 3 (three) times daily. 01/18/21  Yes Carrie Mew, MD  ondansetron (ZOFRAN-ODT) 4 MG disintegrating tablet Take 1 tablet (4 mg total) by mouth every 8 (eight) hours as needed for nausea or vomiting. 01/18/21  Yes Carrie Mew, MD  phenazopyridine (PYRIDIUM) 200 MG tablet Take 1 tablet (200 mg total) by mouth 3 (three) times daily as needed for pain. 01/18/21  Yes Carrie Mew, MD     Allergies Tramadol   History reviewed. No pertinent family history.  Social History Social History   Tobacco Use   Smoking status: Every Day    Packs/day: 0.50    Types: Cigarettes   Smokeless tobacco: Never  Vaping Use   Vaping Use: Never used  Substance Use Topics   Alcohol use: No   Drug use: Yes    Types: Marijuana    Review of Systems  Constitutional:   No fever or chills.  ENT:    No sore throat. No rhinorrhea. Cardiovascular:   No chest pain or syncope. Respiratory:   No dyspnea or cough. Gastrointestinal:   Positive flank pain and constipation.  Musculoskeletal:   Negative for focal pain or swelling All other systems reviewed and are negative except as documented above in ROS and HPI.  ____________________________________________   PHYSICAL EXAM:  VITAL SIGNS: ED Triage Vitals  Enc Vitals Group     BP 01/18/21 1927 (!) 155/107     Pulse Rate 01/18/21 1927 84     Resp 01/18/21 1927 18     Temp 01/18/21 1927 98.7 F (37.1 C)     Temp Source 01/18/21 1927 Oral     SpO2 01/18/21 1927 97 %     Weight 01/18/21 1928 140 lb (63.5 kg)     Height 01/18/21 1928 5\' 9"  (1.753 m)     Head Circumference --      Peak Flow --      Pain Score 01/18/21 1928 8     Pain Loc --      Pain Edu? --      Excl. in [place]? --     Vital signs reviewed, nursing assessments reviewed.   Constitutional:   Alert and oriented. Non-toxic appearance. Eyes:   Conjunctivae are normal. EOMI. PERRL. ENT      Head:   Normocephalic and atraumatic.      Nose:   Wearing a mask.  Mouth/Throat:   Wearing a mask.      Neck:   No meningismus. Full ROM. Hematological/Lymphatic/Immunilogical:   No cervical lymphadenopathy. Cardiovascular:   RRR. Symmetric bilateral radial and DP pulses.  No murmurs. Cap refill less than 2 seconds. Respiratory:   Normal respiratory effort without tachypnea/retractions. Breath sounds are clear and equal bilaterally. No wheezes/rales/rhonchi. Gastrointestinal:   Soft and nontender. Non distended. There is no CVA tenderness.  No rebound, rigidity, or guarding. Genitourinary:   deferred Musculoskeletal:   Normal range of motion in all extremities. No joint effusions.  No lower extremity tenderness.  No edema. Neurologic:   Normal speech and language.  Motor grossly intact. No acute focal neurologic deficits are appreciated.  Skin:    Skin is warm, dry and  intact. No rash noted.  No petechiae, purpura, or bullae.  ____________________________________________    LABS (pertinent positives/negatives) (all labs ordered are listed, but only abnormal results are displayed) Labs Reviewed  URINALYSIS, ROUTINE W REFLEX MICROSCOPIC - Abnormal; Notable for the following components:      Result Value   APPearance CLEAR (*)    Hgb urine dipstick TRACE (*)    Bacteria, UA RARE (*)    All other components within normal limits  BASIC METABOLIC PANEL  CBC   ____________________________________________   EKG    ____________________________________________    RADIOLOGY  CT Renal Stone Study  Result Date: 01/18/2021 CLINICAL DATA:  Right flank pain radiating to right lower quadrant for several days EXAM: CT ABDOMEN AND PELVIS WITHOUT CONTRAST TECHNIQUE: Multidetector CT imaging of the abdomen and pelvis was performed following the standard protocol without IV contrast. COMPARISON:  09/04/2014 FINDINGS: Lower chest: No acute pleural or parenchymal lung disease. Hepatobiliary: Unremarkable unenhanced appearance of the liver and gallbladder. Pancreas: Unremarkable unenhanced appearance. Spleen: Unremarkable unenhanced appearance. Adrenals/Urinary Tract: There is a nonobstructing 4 mm right renal calculus. Left kidney is unremarkable. No obstructive uropathy within either kidney. The bladder is decompressed, limiting its evaluation. Adrenals are unremarkable. Stomach/Bowel: No bowel obstruction or ileus. The appendix, if still present, is not well visualized. No bowel wall thickening or inflammatory change. Vascular/Lymphatic: No significant vascular findings are present. No enlarged abdominal or pelvic lymph nodes. Reproductive: Prostate is unremarkable. Other: No free fluid or free gas.  No abdominal wall hernia. Musculoskeletal: No acute or destructive bony lesions. Reconstructed images demonstrate no additional findings. IMPRESSION: 1. Stable  nonobstructing 4 mm right renal calculus. 2. Otherwise unremarkable unenhanced exam. Electronically Signed   By: Sharlet Salina M.D.   On: 01/18/2021 19:55    ____________________________________________   PROCEDURES Procedures  ____________________________________________  DIFFERENTIAL DIAGNOSIS   Kidney stone, appendicitis, cystitis, constipation  CLINICAL IMPRESSION / ASSESSMENT AND PLAN / ED COURSE  Medications ordered in the ED: Medications  cephALEXin (KEFLEX) capsule 500 mg (500 mg Oral Given 01/18/21 2214)  phenazopyridine (PYRIDIUM) tablet 200 mg (200 mg Oral Given 01/18/21 2213)  ketorolac (TORADOL) tablet 10 mg (10 mg Oral Given 01/18/21 2213)    Pertinent labs & imaging results that were available during my care of the patient were reviewed by me and considered in my medical decision making (see chart for details).  ESEQUIEL KLEINFELTER was evaluated in Emergency Department on 01/18/2021 for the symptoms described in the history of present illness. He was evaluated in the context of the global COVID-19 pandemic, which necessitated consideration that the patient might be at risk for infection with the SARS-CoV-2 virus that causes COVID-19. Institutional protocols and algorithms that pertain to the  evaluation of patients at risk for COVID-19 are in a state of rapid change based on information released by regulatory bodies including the CDC and federal and state organizations. These policies and algorithms were followed during the patient's care in the ED.   Patient presents with right flank pain radiating to the right lower quadrant.  Vital signs are normal, labs are normal.  CT scan is negative, no ureterolithiasis, normal appendix.  Urinalysis does show a small amount of blood in the urine.  This may be cystitis secondary to straining due to constipation, or bacterial.  With persistent symptoms, will treat with Keflex, Pyridium, Zofran, send urine culture.  He stable for discharge.   Recommended stool softener use to avoid constipation in the future.  Presentation is not consistent with STI, orchitis, torsion, bowel obstruction.  Abdomen is benign and nonsurgical      ____________________________________________   FINAL CLINICAL IMPRESSION(S) / ED DIAGNOSES    Final diagnoses:  Acute cystitis with hematuria     ED Discharge Orders          Ordered    cephALEXin (KEFLEX) 500 MG capsule  3 times daily        01/18/21 2238    phenazopyridine (PYRIDIUM) 200 MG tablet  3 times daily PRN        01/18/21 2238    ondansetron (ZOFRAN-ODT) 4 MG disintegrating tablet  Every 8 hours PRN        01/18/21 2238            Portions of this note were generated with dragon dictation software. Dictation errors may occur despite best attempts at proofreading.    Carrie Mew, MD 01/18/21 2250    Carrie Mew, MD 01/18/21 2251

## 2021-01-18 NOTE — ED Triage Notes (Signed)
Pt to ED from home c/o right flank pain radiating to RLQ for a couple days.  States has had urge to urinate frequently but only dribbles.  Denies fevers or n/v/d.  States has felt constipated but did have normal BM today.  Has had hx of a kidney stone but states feels different.  Pt A&Ox4, chest rise even and unlabored, skin WNL and in NAD at this time.

## 2021-07-24 ENCOUNTER — Other Ambulatory Visit: Payer: Self-pay

## 2021-07-24 ENCOUNTER — Emergency Department
Admission: EM | Admit: 2021-07-24 | Discharge: 2021-07-24 | Disposition: A | Discharge status: Home / Self Care | Payer: Self-pay | Attending: Emergency Medicine | Admitting: Emergency Medicine

## 2021-07-24 DIAGNOSIS — Y990 Civilian activity done for income or pay: Secondary | ICD-10-CM | POA: Insufficient documentation

## 2021-07-24 DIAGNOSIS — W228XXA Striking against or struck by other objects, initial encounter: Secondary | ICD-10-CM | POA: Insufficient documentation

## 2021-07-24 DIAGNOSIS — S61217A Laceration without foreign body of left little finger without damage to nail, initial encounter: Secondary | ICD-10-CM | POA: Insufficient documentation

## 2021-07-24 NOTE — Discharge Instructions (Signed)
You were evaluated in the emergency department for a laceration. It was repaired with steri strips as you did not want sutures. Keep the area clean and dry.  Wash multiple times per day with soap and water.  Do not go into the ocean or swimming pool.   Return to the emergency department for:  -- Fever > 100.50F -- Increase pain in the wound -- Increase redness and swelling -- Pus coming from the wound -- Wound bleeds more than a small amount or it does not stop -- Wound edges come apart -- Severe pain -- Weakness or numbness in the affected area  Or any other new or worsening symptoms. It was a pleasure caring for you.

## 2021-07-24 NOTE — ED Provider Notes (Signed)
[facility] Provider Note    Event Date/Time   First MD Initiated Contact with Patient 07/24/21 1449     (approximate)   History   Finger Injury   HPI  DAYMOND CORDTS is a 26 y.o. male who presents today for evaluation of right fifth finger laceration.  Patient reports that he was working on tile when a piece of tile sliced his finger.  He denies numbness or tingling.  He is able to flex and extend his finger normally.  He reports that his tetanus is up-to-date.  No other injury sustained.  There are no problems to display for this patient.         Physical Exam   Triage Vital Signs: ED Triage Vitals  Enc Vitals Group     BP 07/24/21 1350 117/78     Pulse Rate 07/24/21 1350 75     Resp 07/24/21 1350 19     Temp 07/24/21 1350 98.7 F (37.1 C)     Temp Source 07/24/21 1350 Oral     SpO2 07/24/21 1350 100 %     Weight --      Height --      Head Circumference --      Peak Flow --      Pain Score 07/24/21 1351 5     Pain Loc --      Pain Edu? --      Excl. in [place]? --     Most recent vital signs: Vitals:   07/24/21 1350  BP: 117/78  Pulse: 75  Resp: 19  Temp: 98.7 F (37.1 C)  SpO2: 100%    Physical Exam Vitals and nursing note reviewed.  Constitutional:      General: Awake and alert. No acute distress.    Appearance: Normal appearance. He is well-developed and normal weight.  HENT:     Head: Normocephalic and atraumatic.     Mouth: Mucous membranes are moist.  Eyes:     General: PERRL. Normal EOMs        Right eye: No discharge.        Left eye: No discharge.     Conjunctiva/sclera: Conjunctivae normal.  Cardiovascular:     Rate and Rhythm: Normal rate and regular rhythm.     Pulses: Normal pulses.  Pulmonary:     Effort: Pulmonary effort is normal. No respiratory distress.  Abdominal:     Abdomen is soft.  Musculoskeletal:        General: No swelling. Normal range of motion.     Cervical back: Normal range of motion  and neck supple.  Skin:    General: Skin is warm and dry.     Capillary Refill: Capillary refill takes less than 2 seconds.     Findings: No rash.  Left fifth finger: 1 cm laceration to the dorsal radial aspect of base of fifth finger.  Wound edges well approximated.  Patient able to flex and extend at isolated PIP and DIP against resistance.  No bony tenderness.  No nail involvement. Neurological:     Mental Status: He is alert.      ED Results / Procedures / Treatments   Labs (all labs ordered are listed, but only abnormal results are displayed) Labs Reviewed - No data to display   EKG     RADIOLOGY     PROCEDURES:  Critical Care performed:   Marland KitchenMarland KitchenLaceration Repair  Date/Time: 07/24/2021 4:04 PM  Performed by: Jackelyn Hoehn,  PA-C Authorized by: Jackelyn Hoehn, PA-C   Consent:    Consent obtained:  Verbal   Consent given by:  Patient   Risks, benefits, and alternatives were discussed: yes     Risks discussed:  Infection, need for additional repair, nerve damage, poor wound healing, poor cosmetic result, pain, retained foreign body, tendon damage and vascular damage   Alternatives discussed: sutures, patient refused. Universal protocol:    Procedure explained and questions answered to patient or proxy's satisfaction: yes     Relevant documents present and verified: yes     Test results available: yes     Required blood products, implants, devices, and special equipment available: yes     Site/side marked: yes     Immediately prior to procedure, a time out was called: yes     Patient identity confirmed:  Verbally with patient Anesthesia:    Anesthesia method:  None Laceration details:    Location:  Finger   Finger location:  L small finger   Length (cm):  1   Depth (mm):  1 Pre-procedure details:    Preparation:  Patient was prepped and draped in usual sterile fashion Exploration:    Limited defect created (wound extended): no     Hemostasis achieved with:   Direct pressure   Imaging outcome: foreign body not noted     Wound exploration: wound explored through full range of motion and entire depth of wound visualized   Treatment:    Area cleansed with:  Saline and soap and water   Amount of cleaning:  Extensive   Irrigation solution:  Sterile saline and tap water   Irrigation method:  Pressure wash   Debridement:  None   Scar revision: no   Skin repair:    Repair method:  Steri-Strips   Number of Steri-Strips:  1 Repair type:    Repair type:  Simple Post-procedure details:    Dressing:  Open (no dressing)   Procedure completion:  Tolerated well, no immediate complications    MEDICATIONS ORDERED IN ED: Medications - No data to display   IMPRESSION / MDM / ASSESSMENT AND PLAN / ED COURSE  I reviewed the triage vital signs and the nursing notes.   Differential diagnosis includes, but is not limited to, laceration. Patient is neurovascularly intact.  Sensation intact to light touch throughout finger, do not suspect nerve injury.  Able to flex and extend at isolated DIP and PIP, do not suspect tendon injury.  No fingernail or nailbed involvement. Wound fully probed and base of wound visualized, no retained foreign body.  No bony tenderness, mechanism of injury not consistent with fracture.  Wound was anesthetized and irrigated extensively.  Patient refused sutures, requested Steri-Strips instead.  While I recommended sutures for best outcome, wound is amenable to this given that the wound edges are well approximated and the wound is quite small.  Patient prefers steri strips. We discussed wound care and timeline for removal.  Tdap up-to-date. Patient understands and agrees with plan, discharged in stable condition.      Patient's presentation is most consistent with acute, uncomplicated illness.    FINAL CLINICAL IMPRESSION(S) / ED DIAGNOSES   Final diagnoses:  Laceration of left little finger without foreign body without damage to  nail, initial encounter     Rx / DC Orders   ED Discharge Orders     None        Note:  This document was prepared using Dragon voice recognition software  and may include unintentional dictation errors.   Keturah Shavers 07/24/21 1605    Jene Every, MD 07/25/21 1515

## 2021-07-24 NOTE — ED Triage Notes (Signed)
Pt presents to ED with c/o of L pinky area. Pt states he was pulling up flooring and states a piece of tile hit him in this area. Last tetanus "a few years ago"., bleeding controlled.

## 2021-07-24 NOTE — ED Provider Triage Note (Signed)
Emergency Medicine Provider Triage Evaluation Note  Curtis Edwards , a 26 y.o. male  was evaluated in triage.  Pt complains of laceration to left hand on tiles. Last Tdap 2 years ago.  Review of Systems  Positive: Lacation left hand.  Negative: No medications  Physical Exam  BP 117/78 (BP Location: Right Arm)   Pulse 75   Temp 98.7 F (37.1 C) (Oral)   Resp 19   SpO2 100%  Gen:   Awake, no distress   Resp:  Normal effort  MSK:   Moves extremities without difficulty  Other:  Laceration to left hand between 4th and 5th digit.  Medical Decision Making  Medically screening exam initiated at 1:55 PM.  Appropriate orders placed.  Curtis Edwards was informed that the remainder of the evaluation will be completed by another provider, this initial triage assessment does not replace that evaluation, and the importance of remaining in the ED until their evaluation is complete.     Curtis Rumps, PA-C 07/24/21 1359

## 2021-08-05 ENCOUNTER — Other Ambulatory Visit: Payer: Self-pay

## 2021-08-05 ENCOUNTER — Emergency Department
Admission: EM | Admit: 2021-08-05 | Discharge: 2021-08-05 | Disposition: A | Discharge status: Home / Self Care | Payer: Self-pay | Attending: Emergency Medicine | Admitting: Emergency Medicine

## 2021-08-05 ENCOUNTER — Emergency Department: Payer: Self-pay

## 2021-08-05 DIAGNOSIS — D72829 Elevated white blood cell count, unspecified: Secondary | ICD-10-CM | POA: Insufficient documentation

## 2021-08-05 DIAGNOSIS — N132 Hydronephrosis with renal and ureteral calculous obstruction: Secondary | ICD-10-CM | POA: Insufficient documentation

## 2021-08-05 DIAGNOSIS — N200 Calculus of kidney: Secondary | ICD-10-CM

## 2021-08-05 LAB — CBC WITH DIFFERENTIAL/PLATELET
Abs Immature Granulocytes: 0.07 10*3/uL (ref 0.00–0.07)
Basophils Absolute: 0.1 10*3/uL (ref 0.0–0.1)
Basophils Relative: 1 %
Eosinophils Absolute: 0.1 10*3/uL (ref 0.0–0.5)
Eosinophils Relative: 1 %
HCT: 42.1 % (ref 39.0–52.0)
Hemoglobin: 13.6 g/dL (ref 13.0–17.0)
Immature Granulocytes: 1 %
Lymphocytes Relative: 17 %
Lymphs Abs: 2.2 10*3/uL (ref 0.7–4.0)
MCH: 29.1 pg (ref 26.0–34.0)
MCHC: 32.3 g/dL (ref 30.0–36.0)
MCV: 90.1 fL (ref 80.0–100.0)
Monocytes Absolute: 0.6 10*3/uL (ref 0.1–1.0)
Monocytes Relative: 5 %
Neutro Abs: 9.7 10*3/uL — ABNORMAL HIGH (ref 1.7–7.7)
Neutrophils Relative %: 75 %
Platelets: 180 10*3/uL (ref 150–400)
RBC: 4.67 MIL/uL (ref 4.22–5.81)
RDW: 13.1 % (ref 11.5–15.5)
WBC: 12.7 10*3/uL — ABNORMAL HIGH (ref 4.0–10.5)
nRBC: 0 % (ref 0.0–0.2)

## 2021-08-05 LAB — URINALYSIS, ROUTINE W REFLEX MICROSCOPIC
Bacteria, UA: NONE SEEN
Bilirubin Urine: NEGATIVE
Glucose, UA: NEGATIVE mg/dL
Ketones, ur: 5 mg/dL — AB
Nitrite: NEGATIVE
Protein, ur: 100 mg/dL — AB
RBC / HPF: >50 RBC/hpf — ABNORMAL HIGH (ref 0–5)
Specific Gravity, Urine: 1.024 (ref 1.005–1.030)
pH: 6 (ref 5.0–8.0)

## 2021-08-05 LAB — COMPREHENSIVE METABOLIC PANEL
ALT: 13 U/L (ref 0–44)
AST: 20 U/L (ref 15–41)
Albumin: 4.7 g/dL (ref 3.5–5.0)
Alkaline Phosphatase: 62 U/L (ref 38–126)
Anion gap: 5 (ref 5–15)
BUN: 14 mg/dL (ref 6–20)
CO2: 26 mmol/L (ref 22–32)
Calcium: 9.2 mg/dL (ref 8.9–10.3)
Chloride: 107 mmol/L (ref 98–111)
Creatinine, Ser: 0.96 mg/dL (ref 0.61–1.24)
GFR, Estimated: >60 mL/min (ref >60)
Glucose, Bld: 109 mg/dL — ABNORMAL HIGH (ref 70–99)
Potassium: 3.8 mmol/L (ref 3.5–5.1)
Sodium: 138 mmol/L (ref 135–145)
Total Bilirubin: 1.4 mg/dL — ABNORMAL HIGH (ref 0.3–1.2)
Total Protein: 7.9 g/dL (ref 6.5–8.1)

## 2021-08-05 LAB — CHLAMYDIA/NGC RT PCR (ARMC ONLY)
Chlamydia Tr: NOT DETECTED
N gonorrhoeae: NOT DETECTED

## 2021-08-05 LAB — LIPASE, BLOOD: Lipase: 35 U/L (ref 11–51)

## 2021-08-05 MED ORDER — ONDANSETRON 4 MG PO TBDP: 4.0000 mg | ORAL_TABLET | Freq: Four times a day (QID) | ORAL | 0 refills | Status: DC | PRN

## 2021-08-05 MED ORDER — IBUPROFEN 800 MG PO TABS: 800.0000 mg | ORAL_TABLET | Freq: Three times a day (TID) | ORAL | 0 refills | Status: DC | PRN

## 2021-08-05 MED ORDER — ONDANSETRON HCL 4 MG/2ML IJ SOLN
4.0000 mg | Freq: Once | INTRAMUSCULAR | Status: AC
  Administered 2021-08-05: 4 mg via INTRAVENOUS
  Filled 2021-08-05: qty 2

## 2021-08-05 MED ORDER — MORPHINE SULFATE (PF) 4 MG/ML IV SOLN
4.0000 mg | Freq: Once | INTRAVENOUS | Status: AC
  Administered 2021-08-05: 4 mg via INTRAVENOUS
  Filled 2021-08-05: qty 1

## 2021-08-05 MED ORDER — TAMSULOSIN HCL 0.4 MG PO CAPS: 0.4000 mg | ORAL_CAPSULE | Freq: Every day | ORAL | 0 refills | Status: AC

## 2021-08-05 MED ORDER — HYDROMORPHONE HCL 1 MG/ML IJ SOLN
1.0000 mg | Freq: Once | INTRAMUSCULAR | Status: AC
  Administered 2021-08-05: 1 mg via INTRAVENOUS
  Filled 2021-08-05: qty 1

## 2021-08-05 MED ORDER — SODIUM CHLORIDE 0.9 % IV BOLUS (SEPSIS): 1000.0000 mL | Freq: Once | INTRAVENOUS | Status: DC

## 2021-08-05 MED ORDER — OXYCODONE-ACETAMINOPHEN 5-325 MG PO TABS
2.0000 | ORAL_TABLET | Freq: Four times a day (QID) | ORAL | 0 refills | Status: DC | PRN
Start: 2021-08-05 — End: 2022-06-08

## 2021-08-05 MED ORDER — IBUPROFEN 400 MG PO TABS
400.0000 mg | ORAL_TABLET | Freq: Once | ORAL | Status: AC | PRN
  Administered 2021-08-05: 400 mg via ORAL
  Filled 2021-08-05: qty 1

## 2021-08-05 MED ORDER — LACTATED RINGERS IV BOLUS
1000.0000 mL | Freq: Once | INTRAVENOUS | Status: AC
  Administered 2021-08-05: 1000 mL via INTRAVENOUS

## 2021-08-05 MED ORDER — KETOROLAC TROMETHAMINE 30 MG/ML IJ SOLN
30.0000 mg | Freq: Once | INTRAMUSCULAR | Status: AC
  Administered 2021-08-05: 30 mg via INTRAVENOUS
  Filled 2021-08-05: qty 1

## 2021-08-05 NOTE — ED Provider Notes (Signed)
[facility] Provider Note    Event Date/Time   First MD Initiated Contact with Patient 08/05/21 [phone]     (approximate)   History   Urinary Frequency   HPI  Curtis Edwards is a 26 y.o. male with complaints of right flank pain and urinary frequency that started today.  He has had nausea without vomiting.  Has a history of kidney stones and is concerned that he could have another.  No fevers, dysuria, testicular pain or swelling, penile discharge.   History provided by patient and family.    No past medical history on file.  No past surgical history on file.  MEDICATIONS:  Prior to Admission medications   Not on File    Physical Exam   Triage Vital Signs: ED Triage Vitals [08/05/21 0047]  Enc Vitals Group     BP 134/83     Pulse Rate 67     Resp 18     Temp 98.8 F (37.1 C)     Temp Source Oral     SpO2 99 %     Weight      Height      Head Circumference      Peak Flow      Pain Score      Pain Loc      Pain Edu?      Excl. in GC?     Most recent vital signs: Vitals:   08/05/21 0515 08/05/21 0700  BP: 123/71 103/69  Pulse: 68 63  Resp: 18 18  Temp: 98.2 F (36.8 C) 97.9 F (36.6 C)  SpO2: 99% 98%    CONSTITUTIONAL: Alert and oriented and responds appropriately to questions. Well-appearing; well-nourished HEAD: Normocephalic, atraumatic EYES: Conjunctivae clear, pupils appear equal, sclera nonicteric ENT: normal nose; moist mucous membranes NECK: Supple, normal ROM CARD: RRR; S1 and S2 appreciated; no murmurs, no clicks, no rubs, no gallops RESP: Normal chest excursion without splinting or tachypnea; breath sounds clear and equal bilaterally; no wheezes, no rhonchi, no rales, no hypoxia or respiratory distress, speaking full sentences ABD/GI: Normal bowel sounds; non-distended; soft, non-tender, no rebound, no guarding, no peritoneal signs BACK: The back appears normal EXT: Normal ROM in all joints; no deformity noted, no  edema; no cyanosis SKIN: Normal color for age and race; warm; no rash on exposed skin NEURO: Moves all extremities equally, normal speech PSYCH: The patient's mood and manner are appropriate.   ED Results / Procedures / Treatments   LABS: (all labs ordered are listed, but only abnormal results are displayed) Labs Reviewed  URINALYSIS, ROUTINE W REFLEX MICROSCOPIC - Abnormal; Notable for the following components:      Result Value   Color, Urine YELLOW (*)    APPearance HAZY (*)    Hgb urine dipstick LARGE (*)    Ketones, ur 5 (*)    Protein, ur 100 (*)    Leukocytes,Ua SMALL (*)    RBC / HPF >50 (*)    All other components within normal limits  CBC WITH DIFFERENTIAL/PLATELET - Abnormal; Notable for the following components:   WBC 12.7 (*)    Neutro Abs 9.7 (*)    All other components within normal limits  COMPREHENSIVE METABOLIC PANEL - Abnormal; Notable for the following components:   Glucose, Bld 109 (*)    Total Bilirubin 1.4 (*)    All other components within normal limits  CHLAMYDIA/NGC RT PCR ([facility] ONLY)  URINE CULTURE  LIPASE, BLOOD     EKG:   RADIOLOGY: My personal review and interpretation of imaging: CT scan shows a distal 2 mm right ureteral calculus with mild right hydroureteronephrosis.  I have personally reviewed all radiology reports.   CT Renal Stone Study  Result Date: 08/05/2021 CLINICAL DATA:  26 year old male with flank pain. Suspected kidney stone. EXAM: CT ABDOMEN AND PELVIS WITHOUT CONTRAST TECHNIQUE: Multidetector CT imaging of the abdomen and pelvis was performed following the standard protocol without IV contrast. RADIATION DOSE REDUCTION: This exam was performed according to the departmental dose-optimization program which includes automated exposure control, adjustment of the mA and/or kV according to patient size and/or use of iterative reconstruction technique. COMPARISON:  CT the abdomen and pelvis 01/18/2021. FINDINGS: Lower chest:  No definite suspicious unremarkable. Hepatobiliary: No definite suspicious cystic or solid hepatic lesions are confidently identified on today's noncontrast CT examination. Unenhanced appearance of the gallbladder is normal. Pancreas: No definite pancreatic mass or peripancreatic fluid collections or inflammatory changes are noted on today's noncontrast CT examination. Spleen: Unremarkable. Adrenals/Urinary Tract: Numerous nonobstructive calculi are noted within the right renal collecting system measuring up to 4 mm in the interpolar region. In addition, in the distal third of the right ureter (axial image 74 of series 2) there is a 2 mm calculus which is associated with mild proximal right hydroureteronephrosis. No calcifications are noted within the left renal collecting system, along the course of the left ureter or within the lumen of the urinary bladder. Unenhanced appearance of the left kidney and bilateral adrenal glands is otherwise normal. Urinary bladder is nearly completely decompressed, but otherwise unremarkable in appearance. Stomach/Bowel: The appearance of the stomach is normal. There is no pathologic dilatation of small bowel or colon. The appendix is not confidently identified and may be surgically absent. Regardless, there are no inflammatory changes noted adjacent to the cecum to suggest the presence of an acute appendicitis at this time. Vascular/Lymphatic: No atherosclerotic calcifications are noted in the abdominal aorta or pelvic vasculature. No lymphadenopathy noted in the abdomen or pelvis. Reproductive: Prostate gland and seminal vesicles are unremarkable in appearance. Other: No significant volume of ascites.  No pneumoperitoneum. Musculoskeletal: There are no aggressive appearing lytic or blastic lesions noted in the visualized portions of the skeleton. IMPRESSION: 1. In addition to nonobstructive calculi in the right renal collecting system measuring up to 4 mm in the interpolar region,  there is a 2 mm calculus in the distal third of the right ureter with mild proximal right hydroureteronephrosis indicating obstruction. Electronically Signed   By: Trudie Reed M.D.   On: 08/05/2021 06:16     PROCEDURES:  Critical Care performed: No     Procedures    IMPRESSION / MDM / ASSESSMENT AND PLAN / ED COURSE  I reviewed the triage vital signs and the nursing notes.    Patient here with flank pain, nausea, urinary frequency.  History of kidney stones.    DIFFERENTIAL DIAGNOSIS (includes but not limited to):   Kidney stone, UTI, pyelonephritis, doubt appendicitis   Patient's presentation is most consistent with acute presentation with potential threat to life or bodily function.   PLAN: We will obtain CBC, CMP, urinalysis, CT of the abdomen pelvis.  Will give IV fluids, pain and nausea medicine.   MEDICATIONS GIVEN IN ED: Medications  ibuprofen (ADVIL) tablet 400 mg (400 mg Oral Given 08/05/21 0228)  ketorolac (TORADOL) 30 MG/ML injection 30 mg (30 mg Intravenous Given 08/05/21 0538)  morphine (  PF) 4 MG/ML injection 4 mg (4 mg Intravenous Given 08/05/21 0539)  ondansetron (ZOFRAN) injection 4 mg (4 mg Intravenous Given 08/05/21 0538)  lactated ringers bolus 1,000 mL (0 mLs Intravenous Stopped 08/05/21 0649)  HYDROmorphone (DILAUDID) injection 1 mg (1 mg Intravenous Given 08/05/21 [id number])     ED COURSE: Patient's labs show mild leukocytosis which may be reactive.  Normal creatinine, LFTs and lipase.  Urine shows a large amount of blood but no other sign of infection.  Gonorrhea and Chlamydia negative.  Culture is pending.  CT of abdomen pelvis reviewed/interpreted by myself and radiologist and shows 2 mm distal right ureteral calculus with mild right hydroureteronephrosis.  Pain has been well controlled after multiple rounds of medications that he is able to tolerate p.o.  He is comfortable with plan for discharge home with urology follow-up.  Will discharge with pain and  nausea medicine.  Will discharge with Flomax.   At this time, I do not feel there is any life-threatening condition present. I reviewed all nursing notes, vitals, pertinent previous records.  All lab and urine results, EKGs, imaging ordered have been independently reviewed and interpreted by myself.  I reviewed all available radiology reports from any imaging ordered this visit.  Based on my assessment, I feel the patient is safe to be discharged home without further emergent workup and can continue workup as an outpatient as needed. Discussed all findings, treatment plan as well as usual and customary return precautions.  They verbalize understanding and are comfortable with this plan.  Outpatient follow-up has been provided as needed.  All questions have been answered.    CONSULTS: No emergent urologic consult needed at this time.  Patient's pain is currently well controlled and he has no sign of superimposed infection.   OUTSIDE RECORDS REVIEWED: No previous records for review.       FINAL CLINICAL IMPRESSION(S) / ED DIAGNOSES   Final diagnoses:  Kidney stone     Rx / DC Orders   ED Discharge Orders          Ordered    oxyCODONE-acetaminophen (PERCOCET) 5-325 MG tablet  Every 6 hours PRN        08/05/21 0723    ibuprofen (ADVIL) 800 MG tablet  Every 8 hours PRN        08/05/21 0723    ondansetron (ZOFRAN-ODT) 4 MG disintegrating tablet  Every 6 hours PRN        08/05/21 0723    tamsulosin (FLOMAX) 0.4 MG CAPS capsule  Daily        08/05/21 0723             Note:  This document was prepared using Dragon voice recognition software and may include unintentional dictation errors.   Nollie Terlizzi, Delice Bison, DO 08/05/21 [phone]

## 2021-08-05 NOTE — ED Triage Notes (Signed)
Pt states he was drinking water at work today and started to feel like he "had to pee over and over"  No burning or hematuria.

## 2021-08-05 NOTE — ED Notes (Signed)
Pt asleep.

## 2021-08-05 NOTE — ED Notes (Signed)
Pt requesting pain medicaiton.  

## 2021-08-05 NOTE — ED Notes (Signed)
Pt provided with urine cup and instructions for additional urine sample for gc

## 2021-08-06 LAB — URINE CULTURE: Culture: NO GROWTH

## 2021-08-08 ENCOUNTER — Other Ambulatory Visit: Payer: Self-pay

## 2021-08-08 ENCOUNTER — Emergency Department
Admission: EM | Admit: 2021-08-08 | Discharge: 2021-08-08 | Disposition: A | Discharge status: Home / Self Care | Payer: Self-pay | Attending: Emergency Medicine | Admitting: Emergency Medicine

## 2021-08-08 ENCOUNTER — Encounter: Payer: Self-pay | Admitting: Emergency Medicine

## 2021-08-08 DIAGNOSIS — D72829 Elevated white blood cell count, unspecified: Secondary | ICD-10-CM | POA: Insufficient documentation

## 2021-08-08 DIAGNOSIS — N202 Calculus of kidney with calculus of ureter: Secondary | ICD-10-CM | POA: Insufficient documentation

## 2021-08-08 DIAGNOSIS — N200 Calculus of kidney: Secondary | ICD-10-CM

## 2021-08-08 LAB — BASIC METABOLIC PANEL
Anion gap: 10 (ref 5–15)
BUN: 18 mg/dL (ref 6–20)
CO2: 22 mmol/L (ref 22–32)
Calcium: 8.9 mg/dL (ref 8.9–10.3)
Chloride: 104 mmol/L (ref 98–111)
Creatinine, Ser: 1.45 mg/dL — ABNORMAL HIGH (ref 0.61–1.24)
GFR, Estimated: >60 mL/min (ref >60)
Glucose, Bld: 119 mg/dL — ABNORMAL HIGH (ref 70–99)
Potassium: 3.4 mmol/L — ABNORMAL LOW (ref 3.5–5.1)
Sodium: 136 mmol/L (ref 135–145)

## 2021-08-08 LAB — CBC
HCT: 39.9 % (ref 39.0–52.0)
Hemoglobin: 13.5 g/dL (ref 13.0–17.0)
MCH: 29.8 pg (ref 26.0–34.0)
MCHC: 33.8 g/dL (ref 30.0–36.0)
MCV: 88.1 fL (ref 80.0–100.0)
Platelets: 176 10*3/uL (ref 150–400)
RBC: 4.53 MIL/uL (ref 4.22–5.81)
RDW: 12.8 % (ref 11.5–15.5)
WBC: 13.3 10*3/uL — ABNORMAL HIGH (ref 4.0–10.5)
nRBC: 0 % (ref 0.0–0.2)

## 2021-08-08 LAB — URINALYSIS, ROUTINE W REFLEX MICROSCOPIC
Bacteria, UA: NONE SEEN
Bilirubin Urine: NEGATIVE
Glucose, UA: NEGATIVE mg/dL
Ketones, ur: 80 mg/dL — AB
Leukocytes,Ua: NEGATIVE
Nitrite: NEGATIVE
Protein, ur: 30 mg/dL — AB
Specific Gravity, Urine: 1.032 — ABNORMAL HIGH (ref 1.005–1.030)
Squamous Epithelial / HPF: NONE SEEN (ref 0–5)
pH: 5 (ref 5.0–8.0)

## 2021-08-08 MED ORDER — ONDANSETRON HCL 4 MG/2ML IJ SOLN
4.0000 mg | Freq: Once | INTRAMUSCULAR | Status: AC
  Administered 2021-08-08: 4 mg via INTRAVENOUS
  Filled 2021-08-08: qty 2

## 2021-08-08 MED ORDER — SODIUM CHLORIDE 0.9 % IV BOLUS
1000.0000 mL | Freq: Once | INTRAVENOUS | Status: AC
  Administered 2021-08-08: 1000 mL via INTRAVENOUS

## 2021-08-08 MED ORDER — KETOROLAC TROMETHAMINE 15 MG/ML IJ SOLN
15.0000 mg | Freq: Once | INTRAMUSCULAR | Status: AC
Start: 2021-08-08 — End: 2021-08-08
  Administered 2021-08-08: 15 mg via INTRAVENOUS
  Filled 2021-08-08: qty 1

## 2021-08-08 MED ORDER — NAPROXEN 500 MG PO TABS: 500.0000 mg | ORAL_TABLET | Freq: Two times a day (BID) | ORAL | 0 refills | Status: AC

## 2021-08-08 NOTE — ED Notes (Signed)
See triage note  Presents with cont'd pain from kidney stone   States he has been vomiting and increased pain

## 2021-08-08 NOTE — ED Triage Notes (Addendum)
Pt via POV from home. Pt c/o R sided flank pain. States that the pain and nausea is not any better. Pt had a CT on Saturday showing a 64mm and 5mm stone. States that it got worse as soon as he got home. Pt has not set up an appointment with a Urologist. Pt is A&OX4 but pt is crying in triage.

## 2021-08-08 NOTE — Discharge Instructions (Signed)
Please follow up with urology,  call them tomorrow. You may continue the pain medication. Do not take the naproxen with other NSAIDs. Please return for worsening pain, fever, chills, or any other concerns.

## 2021-08-08 NOTE — ED Notes (Signed)
Discharge paperwork provided and reviewed with patient. Followup and RX info reviewed as applicable. Pt provides verbal consent for dc at this time and declines vs at time of dc. pt to lobby alert and oriented. 

## 2021-08-08 NOTE — ED Notes (Signed)
First Nurse Note: Pt to ED via POV, pt was seen recently and diagnosed with 4 mm and 2 mm kidney stone. Pt reports that he has been vomiting and cannot get his pain under control.

## 2021-08-08 NOTE — ED Provider Notes (Signed)
[facility] Provider Note    Event Date/Time   First MD Initiated Contact with Patient 08/08/21 1435     (approximate)   History   Flank Pain   HPI  Curtis Edwards is a 26 y.o. male who presents today for evaluation of right flank pain.  Patient reports that he was diagnosed with a kidney stone 3 days ago.  He reports that he has a history of kidney stones but he has always been able to pass them on his own.  No fevers, urinary retention, testicular pain or swelling, or penile discharge.  He reports that he is having nausea and vomiting due to the pain.  No fevers or chills.     Physical Exam   Triage Vital Signs: ED Triage Vitals  Enc Vitals Group     BP 08/08/21 1422 119/78     Pulse Rate 08/08/21 1422 68     Resp 08/08/21 1422 20     Temp 08/08/21 1422 98.4 F (36.9 C)     Temp Source 08/08/21 1422 Oral     SpO2 08/08/21 1422 100 %     Weight 08/08/21 1419 140 lb (63.5 kg)     Height 08/08/21 1419 5\' 11"  (1.803 m)     Head Circumference --      Peak Flow --      Pain Score 08/08/21 1419 10     Pain Loc --      Pain Edu? --      Excl. in GC? --     Most recent vital signs: Vitals:   08/08/21 1422  BP: 119/78  Pulse: 68  Resp: 20  Temp: 98.4 F (36.9 C)  SpO2: 100%    Physical Exam Vitals and nursing note reviewed.  Constitutional:      General: Awake and alert. No acute distress.    Appearance: Normal appearance. The patient is normal weight.  HENT:     Head: Normocephalic and atraumatic.     Mouth: Mucous membranes are moist.  Eyes:     General: PERRL. Normal EOMs        Right eye: No discharge.        Left eye: No discharge.     Conjunctiva/sclera: Conjunctivae normal.  Cardiovascular:     Rate and Rhythm: Normal rate and regular rhythm.     Pulses: Normal pulses.     Heart sounds: Normal heart sounds Pulmonary:     Effort: Pulmonary effort is normal. No respiratory distress.     Breath sounds: Normal breath sounds.   Abdominal:     Abdomen is soft. There is no abdominal tenderness. No rebound or guarding. No distention. Musculoskeletal:        General: No swelling. Normal range of motion.     Cervical back: Normal range of motion and neck supple.  Skin:    General: Skin is warm and dry.     Capillary Refill: Capillary refill takes less than 2 seconds.     Findings: No rash.  Neurological:     Mental Status: The patient is awake and alert.      ED Results / Procedures / Treatments   Labs (all labs ordered are listed, but only abnormal results are displayed) Labs Reviewed  URINALYSIS, ROUTINE W REFLEX MICROSCOPIC - Abnormal; Notable for the following components:      Result Value   Color, Urine YELLOW (*)    APPearance HAZY (*)    Specific Gravity,  Urine 1.032 (*)    Hgb urine dipstick SMALL (*)    Ketones, ur 80 (*)    Protein, ur 30 (*)    All other components within normal limits  BASIC METABOLIC PANEL - Abnormal; Notable for the following components:   Potassium 3.4 (*)    Glucose, Bld 119 (*)    Creatinine, Ser 1.45 (*)    All other components within normal limits  CBC - Abnormal; Notable for the following components:   WBC 13.3 (*)    All other components within normal limits     EKG     RADIOLOGY     PROCEDURES:  Critical Care performed:   Procedures   MEDICATIONS ORDERED IN ED: Medications  sodium chloride 0.9 % bolus 1,000 mL (0 mLs Intravenous Stopped 08/08/21 1606)  ondansetron (ZOFRAN) injection 4 mg (4 mg Intravenous Given 08/08/21 1455)  ketorolac (TORADOL) 15 MG/ML injection 15 mg (15 mg Intravenous Given 08/08/21 1455)     IMPRESSION / MDM / ASSESSMENT AND PLAN / ED COURSE  I reviewed the triage vital signs and the nursing notes.   Differential diagnosis includes, but is not limited to, nephrolithiasis, urinary obstruction, infected stone.  Patient is awake and alert, hemodynamically stable and afebrile.  I reviewed the patient's chart.  He was most  recently seen on 08/05/2021 with flank pain, nausea, and urinary frequency.  CT renal stone study demonstrated a 4 mm stone and a 2 mm calculus in the distal third of the right ureter.  Patient has been taking Flomax, Norco, and Motrin but continues to have pain.  Labs today reveal a leukocytosis to 13. No fever/chills. UA is without leukocytes, nitrites,WBCs, or bacteria. Given that UA does not appear to be infected, WBC likely stress induced.  He was treated symptomatically with normal saline, Toradol, and Zofran.  Upon reassessment, he reports that his pain has completely resolved. I recommended that he follow up with urology, and the appropriate information was provided. We discussed strict return precautions and the importance of close outpatient follow up. He does have an AKI likely from the stone and his vomiting. He is able to tolerate PO in the ER, and reports that he was nauseated only after the Norco prescribed during his previous ER visit. I advised that he stop taking that medication. Also advised recheck of his creatinine to ensure normalizing. He is not having urinary retention. We discussed return precautions and the importance of close outpatient follow up. Patient understands and agrees with plan. Discharged with significant other.  Patient's presentation is most consistent with acute complicated illness / injury requiring diagnostic workup.  Clinical Course as of 08/08/21 1722  Tue Aug 08, 2021  1556 Patient reports pain resolved, currently 0 [JP]    Clinical Course User Index [JP] Nerissa Constantin, Herb Grays, PA-C     FINAL CLINICAL IMPRESSION(S) / ED DIAGNOSES   Final diagnoses:  Kidney stone     Rx / DC Orders   ED Discharge Orders          Ordered    naproxen (NAPROSYN) 500 MG tablet  2 times daily with meals        08/08/21 1558             Note:  This document was prepared using Dragon voice recognition software and may include unintentional dictation errors.    Keturah Shavers 08/08/21 1722    Dionne Bucy, MD 08/08/21 1925

## 2021-08-10 ENCOUNTER — Ambulatory Visit (INDEPENDENT_AMBULATORY_CARE_PROVIDER_SITE_OTHER): Payer: Self-pay | Admitting: Urology

## 2021-08-10 ENCOUNTER — Encounter: Payer: Self-pay | Admitting: Urology

## 2021-08-10 VITALS — BP 127/77 | HR 71 | Ht 69.0 in | Wt 124.3 lb

## 2021-08-10 DIAGNOSIS — N200 Calculus of kidney: Secondary | ICD-10-CM

## 2021-08-10 NOTE — Patient Instructions (Signed)
Kidney Stones Kidney stones are rock-like masses that form inside of the kidneys. Kidneys are organs that make pee (urine). A kidney stone may move into other parts of the urinary tract, including: The tubes that connect the kidneys to the bladder (ureters). The bladder. The tube that carries urine out of the body (urethra). Kidney stones can cause very bad pain and can block the flow of pee. The stone usually leaves your body (passes) through your pee. You may need to have a doctor take out the stone. What are the causes? Kidney stones may be caused by: A condition in which certain glands make too much parathyroid hormone (primary hyperparathyroidism). A buildup of a type of crystals in the bladder made of a chemical called uric acid. The body makes uric acid when you eat certain foods. Narrowing (stricture) of one or both of the ureters. A kidney blockage that you were born with. Past surgery on the kidney or the ureters, such as gastric bypass surgery. What increases the risk? You are more likely to develop this condition if: You have had a kidney stone in the past. You have a family history of kidney stones. You do not drink enough water. You eat a diet that is high in protein, salt (sodium), or sugar. You are overweight or very overweight (obese). What are the signs or symptoms? Symptoms of a kidney stone may include: Pain in the side of the belly, right below the ribs (flank pain). Pain usually spreads (radiates) to the groin. Needing to pee often or right away (urgently). Pain when going pee (urinating). Blood in your pee (hematuria). Feeling like you may vomit (nauseous). Vomiting. Fever and chills. How is this treated? Treatment depends on the size, location, and makeup of the kidney stones. The stones will often pass out of the body through peeing. You may need to: Drink more fluid to help pass the stone. In some cases, you may be given fluids through an IV tube put into one  of your veins at the hospital. Take medicine for pain. Make changes in your diet to help keep kidney stones from coming back. Sometimes, medical procedures are needed to remove a kidney stone. This may involve: A procedure to break up kidney stones using a beam of light (laser) or shock waves. Surgery to remove the kidney stones. Follow these instructions at home: Medicines Take over-the-counter and prescription medicines only as told by your doctor. Ask your doctor if the medicine prescribed to you requires you to avoid driving or using heavy machinery. Eating and drinking Drink enough fluid to keep your pee pale yellow. You may be told to drink at least 8-10 glasses of water each day. This will help you pass the stone. If told by your doctor, change your diet. This may include: Limiting how much salt you eat. Eating more fruits and vegetables. Limiting how much meat, poultry, fish, and eggs you eat. Follow instructions from your doctor about eating or drinking restrictions. General instructions Collect pee samples as told by your doctor. You may need to collect a pee sample: 24 hours after a stone comes out. 8-12 weeks after a stone comes out, and every 6-12 months after that. Strain your pee every time you pee (urinate), for as long as told. Use the strainer that your doctor recommends. Do not throw out the stone. Keep it so that it can be tested by your doctor. Keep all follow-up visits as told by your doctor. This is important. You may need   follow-up tests. How is this prevented? To prevent another kidney stone: Drink enough fluid to keep your pee pale yellow. This is the best way to prevent kidney stones. Eat healthy foods. Avoid certain foods as told by your doctor. You may be told to eat less protein. Stay at a healthy weight. Where to find more information National Kidney Foundation (NKF): www.kidney.org [facility]): www.urologyhealth.org Contact a doctor  if: You have pain that gets worse or does not get better with medicine. Get help right away if: You have a fever or chills. You get very bad pain. You get new pain in your belly (abdomen). You pass out (faint). You cannot pee. Summary Kidney stones are rock-like masses that form inside of the kidneys. Kidney stones can cause very bad pain and can block the flow of pee. The stones will often pass out of the body through peeing. Drink enough fluid to keep your pee pale yellow. This information is not intended to replace advice given to you by your health care provider. Make sure you discuss any questions you have with your health care provider. Document Revised: 09/26/2020 Document Reviewed: 09/26/2020 Elsevier Patient Education  2023 Elsevier Inc.  Dietary Guidelines to Help Prevent Kidney Stones Kidney stones are deposits of minerals and salts that form inside your kidneys. Your risk of developing kidney stones may be greater depending on your diet, your lifestyle, the medicines you take, and whether you have certain medical conditions. Most people can lower their chances of developing kidney stones by following the instructions below. Your dietitian may give you more specific instructions depending on your overall health and the type of kidney stones you tend to develop. What are tips for following this plan? Reading food labels  Choose foods with "no salt added" or "low-salt" labels. Limit your salt (sodium) intake to less than 1,500 mg a day. Choose foods with calcium for each meal and snack. Try to eat about 300 mg of calcium at each meal. Foods that contain 200-500 mg of calcium a serving include: 8 oz (237 mL) of milk, calcium-fortifiednon-dairy milk, and calcium-fortifiedfruit juice. Calcium-fortified means that calcium has been added to these drinks. 8 oz (237 mL) of kefir, yogurt, and soy yogurt. 4 oz (114 g) of tofu. 1 oz (28 g) of cheese. 1 cup (150 g) of dried figs. 1 cup  (91 g) of cooked broccoli. One 3 oz (85 g) can of sardines or mackerel. Most people need 1,000-1,500 mg of calcium a day. Talk to your dietitian about how much calcium is recommended for you. Shopping Buy plenty of fresh fruits and vegetables. Most people do not need to avoid fruits and vegetables, even if these foods contain nutrients that may contribute to kidney stones. When shopping for convenience foods, choose: Whole pieces of fruit. Pre-made salads with dressing on the side. Low-fat fruit and yogurt smoothies. Avoid buying frozen meals or prepared deli foods. These can be high in sodium. Look for foods with live cultures, such as yogurt and kefir. Choose high-fiber grains, such as whole-wheat breads, oat bran, and wheat cereals. Cooking Do not add salt to food when cooking. Place a salt shaker on the table and allow each person to add his or her own salt to taste. Use vegetable protein, such as beans, textured vegetable protein (TVP), or tofu, instead of meat in pasta, casseroles, and soups. Meal planning Eat less salt, if told by your dietitian. To do this: Avoid eating processed or pre-made food. Avoid eating  fast food. Eat less animal protein, including cheese, meat, poultry, or fish, if told by your dietitian. To do this: Limit the number of times you have meat, poultry, fish, or cheese each week. Eat a diet free of meat at least 2 days a week. Eat only one serving each day of meat, poultry, fish, or seafood. When you prepare animal protein, cut pieces into small portion sizes. For most meat and fish, one serving is about the size of the palm of your hand. Eat at least five servings of fresh fruits and vegetables each day. To do this: Keep fruits and vegetables on hand for snacks. Eat one piece of fruit or a handful of berries with breakfast. Have a salad and fruit at lunch. Have two kinds of vegetables at dinner. Limit foods that are high in a substance called oxalate. These  include: Spinach (cooked), rhubarb, beets, sweet potatoes, and Swiss chard. Peanuts. Potato chips, french fries, and baked potatoes with skin on. Nuts and nut products. Chocolate. If you regularly take a diuretic medicine, make sure to eat at least 1 or 2 servings of fruits or vegetables that are high in potassium each day. These include: Avocado. Banana. Orange, prune, carrot, or tomato juice. Baked potato. Cabbage. Beans and split peas. Lifestyle  Drink enough fluid to keep your urine pale yellow. This is the most important thing you can do. Spread your fluid intake throughout the day. If you drink alcohol: Limit how much you use to: 0-1 drink a day for women who are not pregnant. 0-2 drinks a day for men. Be aware of how much alcohol is in your drink. In the [place] one drink equals one 12 oz bottle of beer (355 mL), one 5 oz glass of wine (148 mL), or one 1 oz glass of hard liquor (44 mL). Lose weight if told by your health care provider. Work with your dietitian to find an eating plan and weight loss strategies that work best for you. General information Talk to your health care provider and dietitian about taking daily supplements. You may be told the following depending on your health and the cause of your kidney stones: Not to take supplements with vitamin C. To take a calcium supplement. To take a daily probiotic supplement. To take other supplements such as magnesium, fish oil, or vitamin B6. Take over-the-counter and prescription medicines only as told by your health care provider. These include supplements. What foods should I limit? Limit your intake of the following foods, or eat them as told by your dietitian. Vegetables Spinach. Rhubarb. Beets. Canned vegetables. Rosita Fire. Olives. Baked potatoes with skin. Grains Wheat bran. Baked goods. Salted crackers. Cereals high in sugar. Meats and other proteins Nuts. Nut butters. Large portions of meat, poultry, or fish.  Salted, precooked, or cured meats, such as sausages, meat loaves, and hot dogs. Dairy Cheese. Beverages Regular soft drinks. Regular vegetable juice. Seasonings and condiments Seasoning blends with salt. Salad dressings. Soy sauce. Ketchup. Barbecue sauce. Other foods Canned soups. Canned pasta sauce. Casseroles. Pizza. Lasagna. Frozen meals. Potato chips. [place] fries. The items listed above may not be a complete list of foods and beverages you should limit. Contact a dietitian for more information. What foods should I avoid? Talk to your dietitian about specific foods you should avoid based on the type of kidney stones you have and your overall health. Fruits Grapefruit. The item listed above may not be a complete list of foods and beverages you should avoid. Contact a dietitian  for more information. Summary Kidney stones are deposits of minerals and salts that form inside your kidneys. You can lower your risk of kidney stones by making changes to your diet. The most important thing you can do is drink enough fluid. Drink enough fluid to keep your urine pale yellow. Talk to your dietitian about how much calcium you should have each day, and eat less salt and animal protein as told by your dietitian. This information is not intended to replace advice given to you by your health care provider. Make sure you discuss any questions you have with your health care provider. Document Revised: 10/03/2020 Document Reviewed: 10/03/2020 Elsevier Patient Education  2023 [employer].

## 2021-08-10 NOTE — Progress Notes (Signed)
   08/10/21 1:35 PM   Jillyn Hidden Elveria Rising November 25, 1995 [id number]  CC: Right ureteral stone  HPI: 26 year old male here with his wife today for evaluation of a right ureteral stone.  He was originally seen in the ER on 08/05/2021 with gross hematuria, nausea, and right-sided flank pain, and CT showed a 2 mm right distal ureteral stone, 4 mm nonobstructive right renal stone, and he was discharged with medical expulsive therapy.  He returned to the ER on 08/08/2021 with nausea secondary to taking the narcotic.  He has been taking primarily NSAIDs, Flomax, and Zofran as needed, and has avoided the narcotics since then.  He continues to have some intermittent right-sided flank pain, and some urgency with urination.  He denies any fevers or chills.  He has a family history of kidney stones.  He denies any personal history of kidney stones.   Social History:  reports that he has been smoking cigarettes. He has been smoking an average of .5 packs per day. He has never used smokeless tobacco. He reports current drug use. Drug: Marijuana. He reports that he does not drink alcohol.  Physical Exam: BP 127/77 (BP Location: Left Arm, Patient Position: Sitting, Cuff Size: Normal)   Pulse 71   Ht 5\' 9"  (1.753 m)   Wt 124 lb 4.8 oz (56.4 kg)   BMI 18.36 kg/m    Constitutional:  Alert and oriented, No acute distress. Cardiovascular: No clubbing, cyanosis, or edema. Respiratory: Normal respiratory effort, no increased work of breathing. GI: Abdomen is soft, nontender, nondistended, no abdominal masses   Pertinent Imaging: I have personally viewed and interpreted the CT showing a 2 mm right distal ureteral stone and 4 mm right upper pole nonobstructive renal stone.  Assessment & Plan:   26 year old male with a 2 mm right distal ureteral stone and no clinical or laboratory evidence of infection, pain currently well controlled with NSAIDs.  We discussed various treatment options for urolithiasis including  observation with or without medical expulsive therapy, shockwave lithotripsy (SWL), ureteroscopy and laser lithotripsy with stent placement, and percutaneous nephrolithotomy.  We discussed that management is based on stone size, location, density, patient co-morbidities, and patient preference.   Stones <85mm in size have a >80% spontaneous passage rate. Data surrounding the use of tamsulosin for medical expulsive therapy is controversial, but meta analyses suggests it is most efficacious for distal stones between 5-10mm in size. Possible side effects include dizziness/lightheadedness, and retrograde ejaculation.  SWL has a lower stone free rate in a single procedure, but also a lower complication rate compared to ureteroscopy and avoids a stent and associated stent related symptoms. Possible complications include renal hematoma, steinstrasse, and need for additional treatment.  Ureteroscopy with laser lithotripsy and stent placement has a higher stone free rate than SWL in a single procedure, however increased complication rate including possible infection, ureteral injury, bleeding, and stent related morbidity. Common stent related symptoms include dysuria, urgency/frequency, and flank pain.  Using shared decision making he opted to continue medical expulsive therapy.  He is extremely averse to surgery with ureteroscopy.  Would not be a candidate for shockwave with the very small size of the stone and location.  We discussed return precautions including uncontrolled pain or nausea or fever over 101.3.  He prefers as needed follow-up  9m, MD 08/10/2021  [facility] [address] [place], [place] [phone]

## 2022-06-08 ENCOUNTER — Emergency Department
Admission: EM | Admit: 2022-06-08 | Discharge: 2022-06-08 | Disposition: A | Discharge status: Home / Self Care | Payer: Self-pay | Attending: Emergency Medicine | Admitting: Emergency Medicine

## 2022-06-08 ENCOUNTER — Encounter: Payer: Self-pay | Admitting: Emergency Medicine

## 2022-06-08 ENCOUNTER — Other Ambulatory Visit: Payer: Self-pay

## 2022-06-08 DIAGNOSIS — K029 Dental caries, unspecified: Secondary | ICD-10-CM

## 2022-06-08 DIAGNOSIS — K047 Periapical abscess without sinus: Secondary | ICD-10-CM | POA: Insufficient documentation

## 2022-06-08 MED ORDER — HYDROCODONE-ACETAMINOPHEN 5-325 MG PO TABS: 1.0000 | ORAL_TABLET | Freq: Four times a day (QID) | ORAL | 0 refills | Status: DC | PRN

## 2022-06-08 MED ORDER — IBUPROFEN 600 MG PO TABS: 600.0000 mg | ORAL_TABLET | Freq: Four times a day (QID) | ORAL | 0 refills | Status: DC | PRN

## 2022-06-08 MED ORDER — HYDROCODONE-ACETAMINOPHEN 5-325 MG PO TABS
1.0000 | ORAL_TABLET | Freq: Once | ORAL | Status: AC
  Administered 2022-06-08: 1 via ORAL
  Filled 2022-06-08: qty 1

## 2022-06-08 MED ORDER — AMOXICILLIN 500 MG PO CAPS
500.0000 mg | ORAL_CAPSULE | Freq: Once | ORAL | Status: AC
  Administered 2022-06-08: 500 mg via ORAL
  Filled 2022-06-08: qty 1

## 2022-06-08 MED ORDER — LIDOCAINE-EPINEPHRINE 2 %-1:100000 IJ SOLN
1.7000 mL | Freq: Once | INTRAMUSCULAR | Status: AC
  Administered 2022-06-08: 1.7 mL via INTRADERMAL
  Filled 2022-06-08: qty 1.7

## 2022-06-08 MED ORDER — AMOXICILLIN 500 MG PO CAPS: 500.0000 mg | ORAL_CAPSULE | Freq: Three times a day (TID) | ORAL | 0 refills | Status: DC

## 2022-06-08 NOTE — Discharge Instructions (Signed)
OPTIONS FOR DENTAL FOLLOW UP CARE ° °[place] Department of Health and Human Services - Local Safety Net Dental Clinics °http://www.ncdhhs.gov/dph/oralhealth/services/safetynetclinics.htm °  °[facility] (336-562-3123) ° °[facility] [place] ([phone]) ° °[facility] ([phone] ext [phone]) ° °[facility] ([phone]) ° °[facility] ([phone]) °This clinic caters to the indigent population and is on a lottery system. °Location: °[facility], [address], [place] °Clinic Hours: °Wednesdays from 6pm - 9pm, patients seen by a lottery system. °For dates, call or go to [email or web address] °Services: °Cleanings, fillings and simple extractions. °Payment Options: °DENTAL WORK IS FREE OF CHARGE. Bring proof of income or support. °Best way to get seen: °Arrive at 5:15 pm - this is a lottery, NOT first come/first serve, so arriving earlier will not increase your chances of being seen. °  °  °[facility] °[phone] °Select option 1 for emergencies °  °Location: °[facility], [address], [place] °Clinic Hours: °No walk-ins accepted - call the day before to schedule an appointment. °Check in times are 9:30 am and 1:30 pm. °Services: °Simple extractions, temporary fillings, pulpectomy/pulp debridement, uncomplicated abscess drainage. °Payment Options: °PAYMENT IS DUE AT THE TIME OF SERVICE.  Fee is usually $100-200, additional surgical procedures (e.g. abscess drainage) may be extra. °Cash, checks, Visa/MasterCard accepted.  Can file Medicaid if patient is covered for dental - patient should call case worker to check. °No discount for [facility] patients. °Best way to get seen: °MUST call the day before and get onto the schedule. Can usually be seen the next 1-2 days. No walk-ins accepted. °  °  °[facility] °[phone] °   °Location: °[facility], [address], [place] °Clinic Hours: °M, W, Th, F 8am or 1:30pm, Tues 9a or 1:30 - first come/first served. °Services: °Simple extractions, temporary fillings, uncomplicated abscess drainage.  You do not need to be an [place] resident. °Payment Options: °PAYMENT IS DUE AT THE TIME OF SERVICE. °Dental insurance, otherwise sliding scale - bring proof of income or support. °Depending on income and treatment needed, cost is usually $50-200. °Best way to get seen: °Arrive early as it is first come/first served. °  °  °[facility] °[phone] °  °Location: °[address] °Clinic Hours: °Mon-Thu 8a-5p °Services: °Most basic dental services including extractions and fillings. °Payment Options: °PAYMENT IS DUE AT THE TIME OF SERVICE. °Sliding scale, up to 50% off - bring proof if income or support. °Medicaid with dental option accepted. °Best way to get seen: °Call to schedule an appointment, can usually be seen within 2 weeks OR they will try to see walk-ins - show up at 8a or 2p (you may have to wait). °  °  °[facility] °[phone] °[place] RESIDENTS ONLY °  °Location: °[facility], [address], [place], [place] [address] °Clinic Hours: By appointment only. °Monday - Thursday 8am-5pm, Friday 8am-12pm °Services: Cleanings, fillings, extractions. °Payment Options: °PAYMENT IS DUE AT THE TIME OF SERVICE. °Cash, Visa or MasterCard. Sliding scale - $30 minimum per service. °Best way to get seen: °Come in to office, complete packet and make an appointment - need proof of income °or support monies for each household member and proof of [place] residence. °Usually takes about a month to get in. °  °  °[facility] °[phone] °  °Location: °[address]   [place] °Clinic Hours: Walk-in Urgent Care Dental Services are offered Monday-Friday  mornings only. °The numbers of emergencies accepted daily is limited to the number of °providers available. °Maximum 15 - Mondays, Wednesdays & Thursdays °Maximum 10 - Tuesdays & Fridays °Services: °You do not need to be a [place] resident to be seen for a dental emergency. °Emergencies are defined as pain, swelling, abnormal bleeding, or dental trauma. Walkins will receive x-rays if needed. °NOTE: Dental cleaning is not an emergency. °Payment Options: °PAYMENT IS DUE AT THE TIME OF SERVICE. °Minimum co-pay is $40.00 for uninsured patients. °Minimum co-pay is $3.00 for Medicaid with dental coverage. °Dental Insurance is accepted and must be presented at time of visit. °Medicare does not cover dental. °Forms of payment: Cash, credit card, checks. °Best way to get seen: °If not previously registered with the clinic, walk-in dental registration begins at 7:15 am and is on a first come/first serve basis. °If previously registered with the clinic, call to make an appointment. °  °  °[facility] °[phone] °[place] RESIDENTS ONLY °  °Location: °[address], [place], [place] °Clinic Hours: °Mon-Thu 10a-2p °Services: Extractions only! °Payment Options: °FREE (donations accepted) - bring proof of income or support °Best way to get seen: °Call and schedule an appointment OR come at 8am on the 1st Monday of every month (except for holidays) when it is first come/first served. °  °  °[facility] °[phone] °  °Location: °[address], [place] °Clinic Hours: °Friday mornings °Services, Payment Options, Best way to get seen: °Call for info °

## 2022-06-08 NOTE — ED Triage Notes (Signed)
Patient to ED for dental pain. States he has a tooth that needs to be pulled. Pain for past week. Crying in triage.

## 2022-06-08 NOTE — ED Provider Notes (Signed)
[facility] Provider Note    Event Date/Time   First MD Initiated Contact with Patient 06/08/22 1358     (approximate)   History   Dental Pain   HPI  Curtis Edwards is a 27 y.o. male with history of kidney stones presents emergency department complaining of severe right tooth pain.  Patient states he has a broken tooth and tried to call a dentist but no one is open and he may not have the money to have the tooth pulled.  Denies fever or chills.  Took ibuprofen without any relief.       Physical Exam   Triage Vital Signs: ED Triage Vitals  Enc Vitals Group     BP 06/08/22 1341 126/88     Pulse Rate 06/08/22 1341 72     Resp 06/08/22 1341 18     Temp 06/08/22 1341 98 F (36.7 C)     Temp Source 06/08/22 1341 Oral     SpO2 06/08/22 1341 100 %     Weight --      Height --      Head Circumference --      Peak Flow --      Pain Score 06/08/22 1340 10     Pain Loc --      Pain Edu? --      Excl. in GC? --     Most recent vital signs: Vitals:   06/08/22 1341  BP: 126/88  Pulse: 72  Resp: 18  Temp: 98 F (36.7 C)  SpO2: 100%     General: Awake, no distress.   CV:  Good peripheral perfusion. regular rate and  rhythm Resp:  Normal effort. Lungs cta Abd:  No distention.   Other:  Poor dentition noted, right upper molar is broken at the gumline with a lot of tenderness in the area, neck is supple, no lymphadenopathy   ED Results / Procedures / Treatments   Labs (all labs ordered are listed, but only abnormal results are displayed) Labs Reviewed - No data to display   EKG     RADIOLOGY     PROCEDURES:   .Nerve Block  Date/Time: 06/08/2022 2:53 PM  Performed by: Faythe Ghee, PA-C Authorized by: Faythe Ghee, PA-C   Consent:    Consent obtained:  Verbal   Consent given by:  Patient   Risks, benefits, and alternatives were discussed: yes     Risks discussed:  Allergic reaction, infection, nerve damage, swelling,  unsuccessful block, pain, intravenous injection and bleeding   Alternatives discussed:  No treatment Universal protocol:    Procedure explained and questions answered to patient or proxy's satisfaction: yes     Immediately prior to procedure, a time out was called: yes     Patient identity confirmed:  Verbally with patient Indications:    Indications:  Pain relief Location:    Body area:  Head   Laterality:  Right Procedure details:    Block needle gauge:  30 G   Anesthetic injected:  Lidocaine 2% WITH epi   Injection procedure:  Anatomic landmarks identified, incremental injection, negative aspiration for blood, anatomic landmarks palpated and introduced needle   Paresthesia:  None Post-procedure details:    Outcome:  Pain relieved   Procedure completion:  Tolerated well, no immediate complications Comments:     Dental block for pain control    MEDICATIONS ORDERED IN ED: Medications  lidocaine-EPINEPHrine (XYLOCAINE W/EPI) 2 %-1:100000 (with pres) injection 1.7  mL (1.7 mLs Intradermal Given by Other 06/08/22 1409)  amoxicillin (AMOXIL) capsule 500 mg (500 mg Oral Given 06/08/22 1422)  HYDROcodone-acetaminophen (NORCO/VICODIN) 5-325 MG per tablet 1 tablet (1 tablet Oral Given 06/08/22 1422)     IMPRESSION / MDM / ASSESSMENT AND PLAN / ED COURSE  I reviewed the triage vital signs and the nursing notes.                              Differential diagnosis includes, but is not limited to, dental abscess, dental caries, fractured tooth  Patient's presentation is most consistent with acute complicated illness / injury requiring diagnostic workup.   Patient's presentation most typical of a dental abscess/dental caries.  Patient does appear to have a good deal of pain as rocking and crying on the stretcher.  See dental block procedure for numbing medication inserted for pain control.  He was also given amoxicillin and a Vicodin while here in the ED.  Prescription for amoxicillin, ibuprofen  and hydrocodone sent to the pharmacy.  He is to see your regular dentist.  Return emergency department worsening.  In agreement with treatment plan.  Discharged stable condition.     FINAL CLINICAL IMPRESSION(S) / ED DIAGNOSES   Final diagnoses:  Pain due to dental caries  Dental abscess     Rx / DC Orders   ED Discharge Orders          Ordered    amoxicillin (AMOXIL) 500 MG capsule  3 times daily        06/08/22 1420    ibuprofen (ADVIL) 600 MG tablet  Every 6 hours PRN        06/08/22 1420    HYDROcodone-acetaminophen (NORCO/VICODIN) 5-325 MG tablet  Every 6 hours PRN        06/08/22 1420             Note:  This document was prepared using Dragon voice recognition software and may include unintentional dictation errors.    Faythe Ghee, PA-C 06/08/22 1456    Sharman Cheek, MD 06/09/22 2325

## 2022-10-14 ENCOUNTER — Other Ambulatory Visit: Payer: Self-pay

## 2022-10-14 ENCOUNTER — Emergency Department
Admission: EM | Admit: 2022-10-14 | Discharge: 2022-10-14 | Disposition: A | Discharge status: Home / Self Care | Payer: Self-pay | Attending: Emergency Medicine | Admitting: Emergency Medicine

## 2022-10-14 DIAGNOSIS — K029 Dental caries, unspecified: Secondary | ICD-10-CM | POA: Insufficient documentation

## 2022-10-14 DIAGNOSIS — H9202 Otalgia, left ear: Secondary | ICD-10-CM | POA: Insufficient documentation

## 2022-10-14 MED ORDER — HYDROCODONE-ACETAMINOPHEN 5-325 MG PO TABS
1.0000 | ORAL_TABLET | Freq: Once | ORAL | Status: AC
  Administered 2022-10-14: 1 via ORAL
  Filled 2022-10-14: qty 1

## 2022-10-14 MED ORDER — AMOXICILLIN 500 MG PO CAPS
500.0000 mg | ORAL_CAPSULE | Freq: Once | ORAL | Status: AC
  Administered 2022-10-14: 500 mg via ORAL
  Filled 2022-10-14: qty 1

## 2022-10-14 MED ORDER — AMOXICILLIN 500 MG PO CAPS: 500.0000 mg | ORAL_CAPSULE | Freq: Three times a day (TID) | ORAL | 0 refills | Status: AC

## 2022-10-14 NOTE — Discharge Instructions (Addendum)
Take antibiotic as directed until all pills are gone.  Follow-up with one of the dental clinics listed below.  OPTIONS FOR DENTAL FOLLOW UP CARE  [place] [facility] - Local Safety Net Dental Clinics [email or web address]   [facility] [phone])  Curtis Edwards [phone])  [place] [phone])  [facility] [phone])  [facility] [phone]) This clinic caters to the indigent population and is on a lottery system. Location: [employer] [facility], [employer], [address] [place] Clinic Hours: Wednesdays from 6pm - 9pm, patients seen by a lottery system. For dates, call or go to [email or web address] Services: Cleanings, fillings and simple extractions. Payment Options: DENTAL WORK IS FREE OF CHARGE. Bring proof of income or support. Best way to get seen: Arrive at 5:15 pm - this is a lottery, NOT first come/first serve, so arriving earlier will not increase your chances of being seen.     [facility] [phone] Select option 1 for emergencies   Location: [facility], [place], [address], [place] Clinic Hours: No walk-ins accepted - call the day before to schedule an appointment. Check in times are 9:30 am and 1:30 pm. Services: Simple extractions, temporary fillings, pulpectomy/pulp debridement, uncomplicated abscess drainage. Payment Options: PAYMENT IS DUE AT THE TIME OF SERVICE.  Fee is usually $100-200, additional surgical procedures (e.g. abscess drainage) may be extra. Cash, checks, Visa/MasterCard accepted.  Can file Medicaid if patient is covered for dental - patient should call case worker to check. No discount for [facility] patients. Best way to get seen: MUST call the day before and get onto the schedule. Can  usually be seen the next 1-2 days. No walk-ins accepted.     [facility] [phone]   Location: [facility], [address] [place] Clinic Hours: M, W, Th, F 8am or 1:30pm, Tues 9a or 1:30 - first come/first served. Services: Simple extractions, temporary fillings, uncomplicated abscess drainage.  You do not need to be an [facility] resident. Payment Options: PAYMENT IS DUE AT THE TIME OF SERVICE. Dental insurance, otherwise sliding scale - bring proof of income or support. Depending on income and treatment needed, cost is usually $50-200. Best way to get seen: Arrive early as it is first come/first served.     [facility] [phone]   Location: [address] Clinic Hours: Mon-Thu 8a-5p Services: Most basic dental services including extractions and fillings. Payment Options: PAYMENT IS DUE AT THE TIME OF SERVICE. Sliding scale, up to 50% off - bring proof if income or support. Medicaid with dental option accepted. Best way to get seen: Call to schedule an appointment, can usually be seen within 2 weeks OR they will try to see walk-ins - show up at 8a or 2p (you may have to wait).     [facility] [phone] [place] RESIDENTS ONLY   Location: [facility], [address] [place], [place] [address] Clinic Hours: By appointment only. Monday - Thursday 8am-5pm, Friday 8am-12pm Services: Cleanings, fillings, extractions. Payment Options: PAYMENT IS DUE AT THE TIME OF SERVICE. Cash, Visa or MasterCard. Sliding scale - $30 minimum per service. Best way to get seen: Come in to office, complete packet and make an appointment - need proof of income or support monies for each household member and proof of [facility] residence. Usually takes about a  month to get in.     [facility] [phone]   Location: [address], [facility] Hours: Walk-in Urgent Care Dental Services are offered Monday-Friday mornings only. The numbers of emergencies accepted daily is limited to the number of providers available. Maximum 15 - Mondays, Wednesdays & Thursdays Maximum 10 - Tuesdays & Fridays Services: You do not need to be a [facility] resident to be seen for a dental emergency. Emergencies are defined as pain, swelling, abnormal bleeding, or dental trauma. Walkins will receive x-rays if needed. NOTE: Dental cleaning is not an emergency. Payment Options: PAYMENT IS DUE AT THE TIME OF SERVICE. Minimum co-pay is $40.00 for uninsured patients. Minimum co-pay is $3.00 for Medicaid with dental coverage. Dental Insurance is accepted and must be presented at time of visit. Medicare does not cover dental. Forms of payment: Cash, credit card, checks. Best way to get seen: If not previously registered with the clinic, walk-in dental registration begins at 7:15 am and is on a first come/first serve basis. If previously registered with the clinic, call to make an appointment.     [facility] [phone] [place] RESIDENTS ONLY   Location: [address] [place], [place] Clinic Hours: Mon-Thu 10a-2p Services: Extractions only! Payment Options: FREE (donations accepted) - bring proof of income or support Best way to get seen: Call and schedule an appointment OR come at 8am on the 1st Monday of every month (except for holidays) when it is first come/first served.     [facility] [phone]   Location: [address] [place], [place] Clinic Hours: Friday mornings Services, Payment Options, Best way to get seen: Call for info

## 2022-10-14 NOTE — ED Triage Notes (Signed)
Pt c/o left ear pain that started today around 13:00 while at work, then left side of face started hurting around 15:30. Pt reports he is not sure if it is just his ear or his teeth too.

## 2022-10-14 NOTE — ED Provider Notes (Signed)
[facility] Emergency Department Provider Note     Event Date/Time   First MD Initiated Contact with Patient 10/14/22 2140     (approximate)   History   Otalgia   HPI  Curtis Edwards is a 27 y.o. male with a noncontributory medical history, presents to the ED for left-sided ear pain that began about 1:00 this afternoon.  Patient also endorses left-sided facial pain with onset around 3 that is draining.  He is unclear whether symptoms are related to his ear or his teeth.  He denies any fevers, chills, sweats, chest pain, shortness breath.  No difficulty breathing, swallowing work secretions, hearing, tinnitus, vertigo, dizziness reported.  Patient does report poor dentition, noting several broken and decayed molars.   Physical Exam   Triage Vital Signs: ED Triage Vitals  Encounter Vitals Group     BP 10/14/22 1948 120/83     Systolic BP Percentile --      Diastolic BP Percentile --      Pulse Rate 10/14/22 1948 71     Resp 10/14/22 1948 18     Temp 10/14/22 1948 98.1 F (36.7 C)     Temp Source 10/14/22 1948 Oral     SpO2 10/14/22 1948 100 %     Weight 10/14/22 1947 125 lb (56.7 kg)     Height 10/14/22 1947 5\' 9"  (1.753 m)     Head Circumference --      Peak Flow --      Pain Score 10/14/22 1947 9     Pain Loc --      Pain Education --      Exclude from Growth Chart --     Most recent vital signs: Vitals:   10/14/22 1948  BP: 120/83  Pulse: 71  Resp: 18  Temp: 98.1 F (36.7 C)  SpO2: 100%    General Awake, no distress. NAD HEENT NCAT. PERRL. EOMI. is intact bilaterally without purulent or serous effusions no rhinorrhea. Mucous membranes are moist.  Uvula is midline tonsils are flat.  No oropharyngeal lesions appreciated.  Multiple decayed and broken molars to the upper and lower jaw bilaterally without evidence of focal soft tissue swelling, erythema, pointing, fluctuance.  No bony cervical edema is noted. CV:  Good peripheral  perfusion.  RESP:  Normal effort.  ABD:  No distention.    ED Results / Procedures / Treatments   Labs (all labs ordered are listed, but only abnormal results are displayed) Labs Reviewed - No data to display   EKG   RADIOLOGY  No results found.   PROCEDURES:  Critical Care performed: No  Procedures   MEDICATIONS ORDERED IN ED: Medications  amoxicillin (AMOXIL) capsule 500 mg (500 mg Oral Given 10/14/22 2217)  HYDROcodone-acetaminophen (NORCO/VICODIN) 5-325 MG per tablet 1 tablet (1 tablet Oral Given 10/14/22 2217)     IMPRESSION / MDM / ASSESSMENT AND PLAN / ED COURSE  I reviewed the triage vital signs and the nursing notes.                              Differential diagnosis includes, but is not limited to, AOM, otitis externa, TMJ dysfunction, dental pain, dental caries  Patient's presentation is most consistent with acute complicated illness / injury requiring diagnostic workup.  Patient's diagnosis is consistent with dental pain secondary to caries. Patient will be discharged home with prescriptions for Augmentin. Patient is to follow  up with any local dental provider on the list given, as needed or otherwise directed. Patient is given ED precautions to return to the ED for any worsening or new symptoms.     FINAL CLINICAL IMPRESSION(S) / ED DIAGNOSES   Final diagnoses:  Dental caries  Pain due to dental caries     Rx / DC Orders   ED Discharge Orders          Ordered    amoxicillin (AMOXIL) 500 MG capsule  3 times daily        10/14/22 2149             Note:  This document was prepared using Dragon voice recognition software and may include unintentional dictation errors.    Lissa Hoard, PA-C 10/14/22 2321    Merwyn Katos, MD 10/14/22 [phone]

## 2022-10-15 NOTE — Group Note (Deleted)

## 2023-08-10 ENCOUNTER — Encounter: Payer: Self-pay | Admitting: Emergency Medicine

## 2023-08-10 ENCOUNTER — Other Ambulatory Visit: Payer: Self-pay

## 2023-08-10 DIAGNOSIS — Y92410 Unspecified street and highway as the place of occurrence of the external cause: Secondary | ICD-10-CM | POA: Diagnosis not present

## 2023-08-10 DIAGNOSIS — K029 Dental caries, unspecified: Secondary | ICD-10-CM | POA: Diagnosis not present

## 2023-08-10 DIAGNOSIS — R519 Headache, unspecified: Secondary | ICD-10-CM | POA: Diagnosis present
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
## 2023-08-11 ENCOUNTER — Emergency Department
Admission: EM | Admit: 2023-08-11 | Discharge: 2023-08-11 | Disposition: A | Discharge status: Home / Self Care | Payer: Self-pay

## 2023-08-11 ENCOUNTER — Emergency Department

## 2023-08-11 DIAGNOSIS — K029 Dental caries, unspecified: Secondary | ICD-10-CM

## 2023-08-11 DIAGNOSIS — R519 Headache, unspecified: Secondary | ICD-10-CM

## 2023-08-11 MED ORDER — KETOROLAC TROMETHAMINE 30 MG/ML IJ SOLN
30.0000 mg | Freq: Once | INTRAMUSCULAR | Status: AC
  Administered 2023-08-11: 30 mg via INTRAMUSCULAR
  Filled 2023-08-11: qty 1

## 2023-08-11 MED ORDER — ACETAMINOPHEN 500 MG PO TABS
1000.0000 mg | ORAL_TABLET | Freq: Once | ORAL | Status: AC
  Administered 2023-08-11: 1000 mg via ORAL
  Filled 2023-08-11: qty 2

## 2023-08-11 MED ORDER — IBUPROFEN 200 MG PO TABS: 600.0000 mg | ORAL_TABLET | Freq: Three times a day (TID) | ORAL | 2 refills | Status: AC | PRN

## 2023-08-11 MED ORDER — AMOXICILLIN-POT CLAVULANATE 875-125 MG PO TABS: 1.0000 | ORAL_TABLET | Freq: Two times a day (BID) | ORAL | 0 refills | Status: AC

## 2023-08-11 MED ORDER — ACETAMINOPHEN 500 MG PO TABS: 1000.0000 mg | ORAL_TABLET | Freq: Four times a day (QID) | ORAL | 2 refills | Status: AC | PRN

## 2023-08-11 MED ORDER — AMOXICILLIN-POT CLAVULANATE 875-125 MG PO TABS
1.0000 | ORAL_TABLET | Freq: Once | ORAL | Status: AC
  Administered 2023-08-11: 1 via ORAL
  Filled 2023-08-11: qty 1

## 2023-08-11 MED ORDER — ONDANSETRON 4 MG PO TBDP
4.0000 mg | ORAL_TABLET | Freq: Once | ORAL | Status: AC
  Administered 2023-08-11: 4 mg via ORAL
  Filled 2023-08-11: qty 1
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
## 2023-08-11 NOTE — Discharge Instructions (Addendum)
 Your evaluation in the emergency department was reassuring, and CT scans of your head and face showed no abnormalities.  I do suspect your pain is likely due to a flare from your dental cavities, and I agree that you need to see a dentist soon as possible.  It is possible there may be a mild infection in this area, and I have prescribed you an antibiotic to treat this--please take the full course as prescribed.  I have also prescribed you Tylenol  and Motrin  to use as needed for any ongoing discomfort.  Please also follow-up with your primary care provider for any ongoing symptoms, and return to the emergency department with any new or worsening symptoms.

## 2023-08-14 MED ORDER — FAMOTIDINE 20 MG PO TABS
ORAL_TABLET | ORAL | Status: AC
  Filled 2023-08-14: qty 2

## 2023-08-14 MED ORDER — DIPHENHYDRAMINE HCL 50 MG/ML IJ SOLN
INTRAMUSCULAR | Status: AC
  Filled 2023-08-14: qty 1

## 2023-08-14 MED ORDER — METHYLPREDNISOLONE SODIUM SUCC 125 MG IJ SOLR
INTRAMUSCULAR | Status: AC
  Filled 2023-08-14: qty 2

## 2023-12-01 IMAGING — CT CT RENAL STONE PROTOCOL
2 of 4 series · 16 of 46 positions shown, 18 images · non-contrast
Comparison: 09/04/2014

CLINICAL DATA: Right flank pain radiating to right lower quadrant
for several days

EXAM:
CT ABDOMEN AND PELVIS WITHOUT CONTRAST
TECHNIQUE: Multidetector CT imaging of the abdomen and pelvis was performed
following the standard protocol without IV contrast.

[Series 2: stone full standard · axial · 0.66mm/px · z∈[-1012,-597]mm · 13 of 91 slices shown, 15 images]
[im 4/91  soft-tissue]
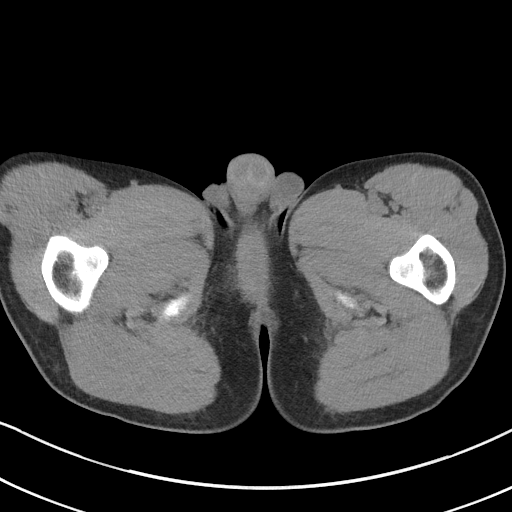
[im 4/91  bone]
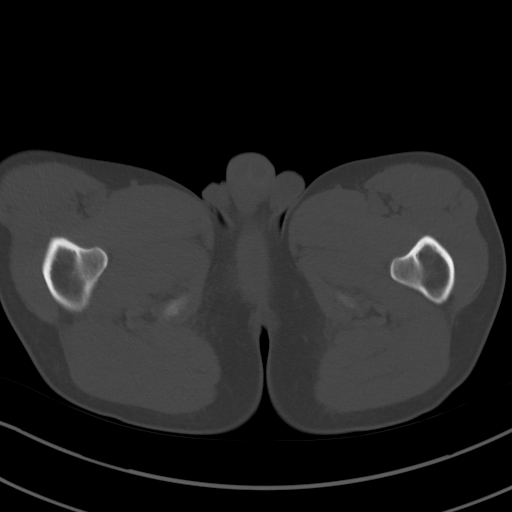
[im 11/91  soft-tissue]
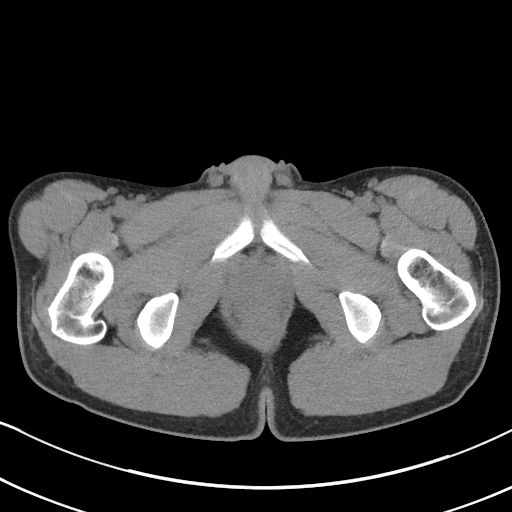
[im 19/91  soft-tissue]
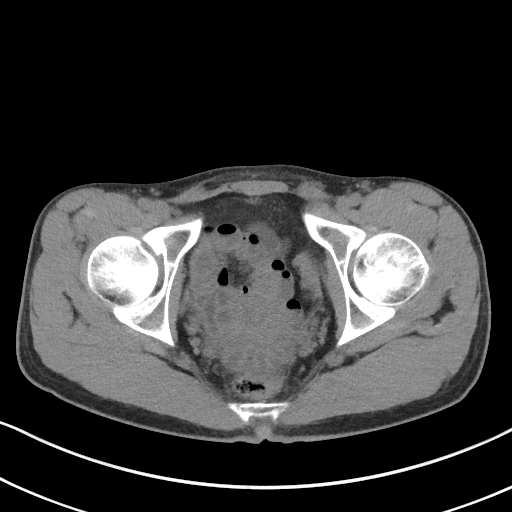
[im 26/91  soft-tissue]
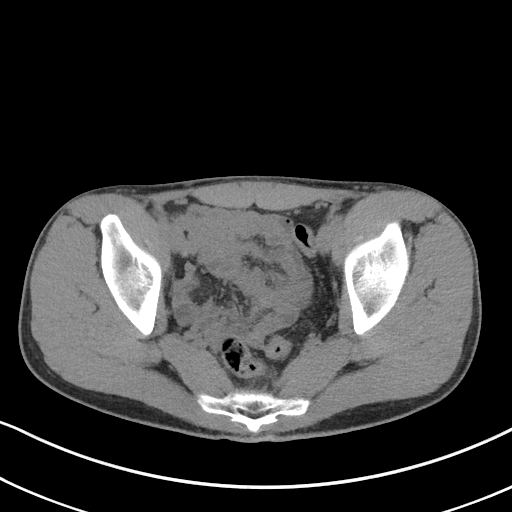
[im 33/91  soft-tissue]
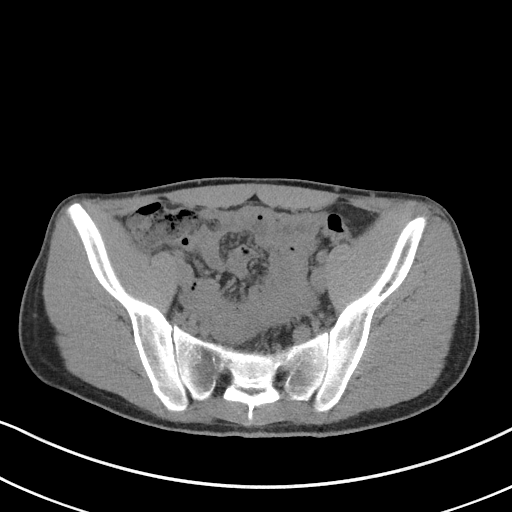
[im 40/91  soft-tissue]
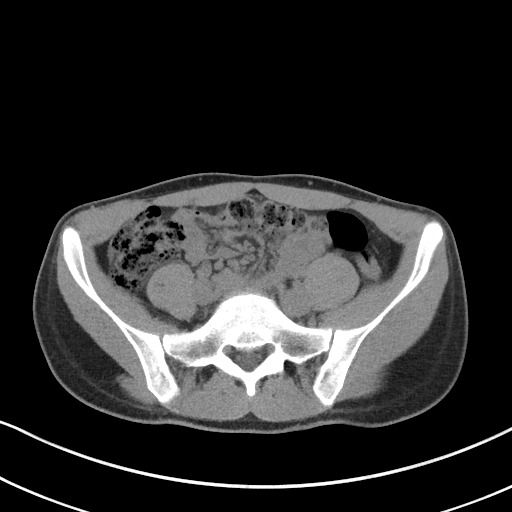
[im 47/91  soft-tissue]
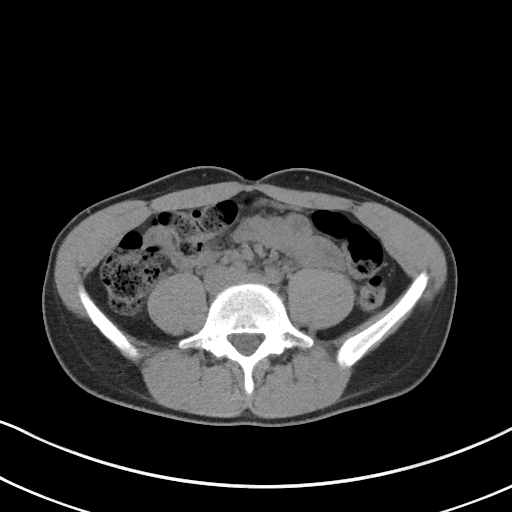
[im 51/91  soft-tissue]
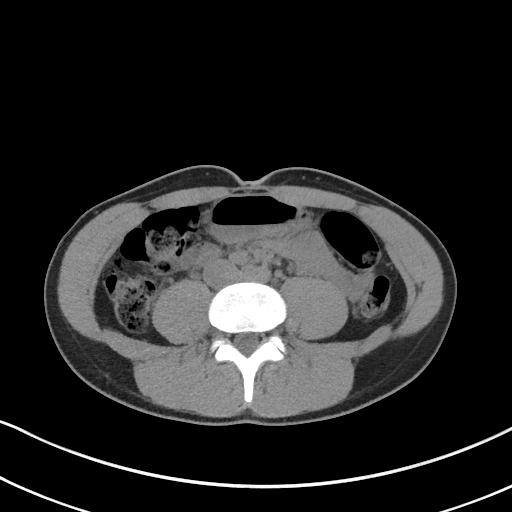
[im 58/91  soft-tissue]
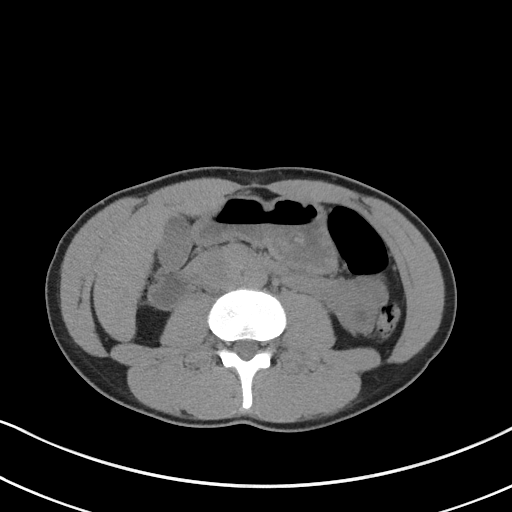
[im 58/91  bone]
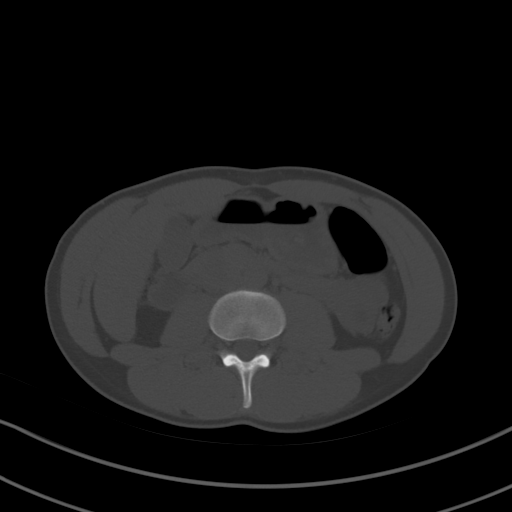
[im 65/91  soft-tissue]
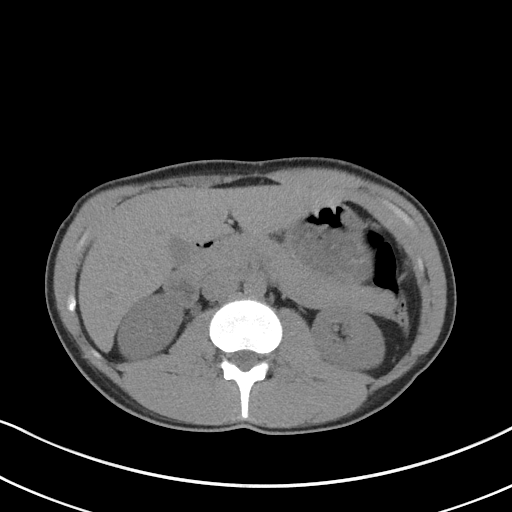
[im 73/91  soft-tissue]
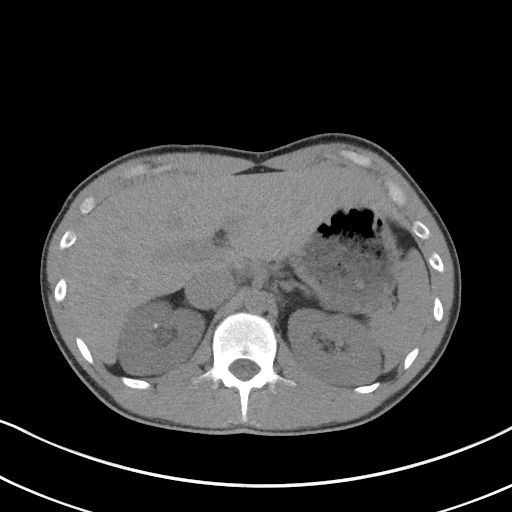
[im 80/91  soft-tissue]
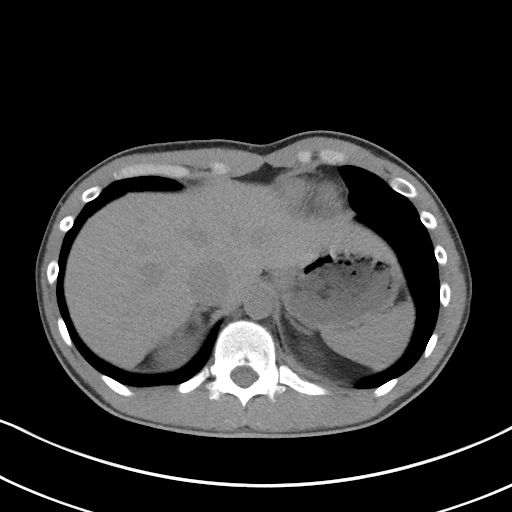
[im 87/91  soft-tissue]
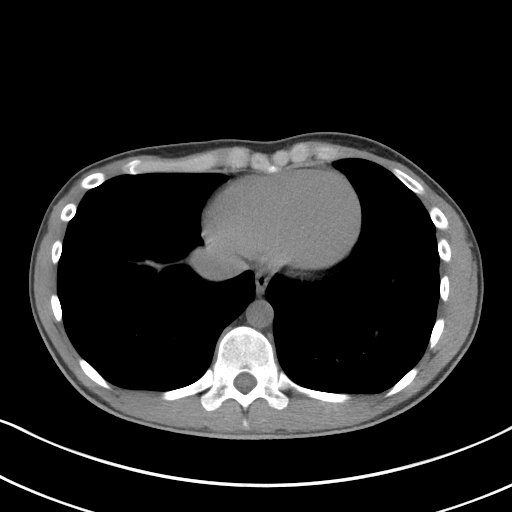

[Series 5: coronal · coronal · 0.65mm/px · 3 of 102 slices shown]
[im 34/102  soft-tissue]
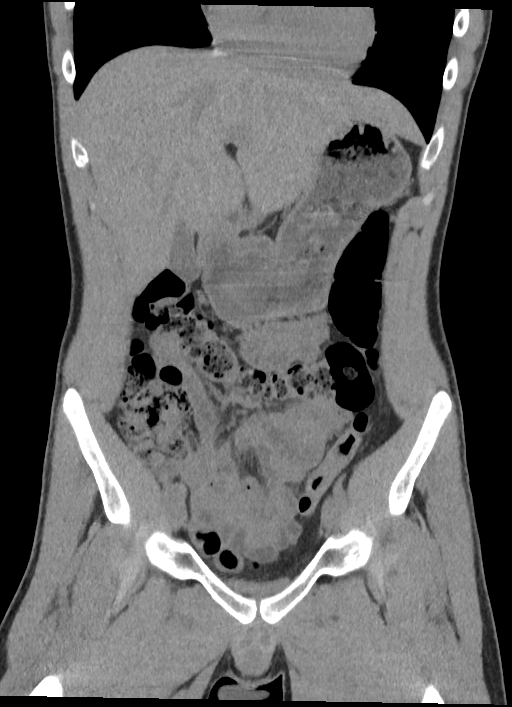
[im 45/102  soft-tissue]
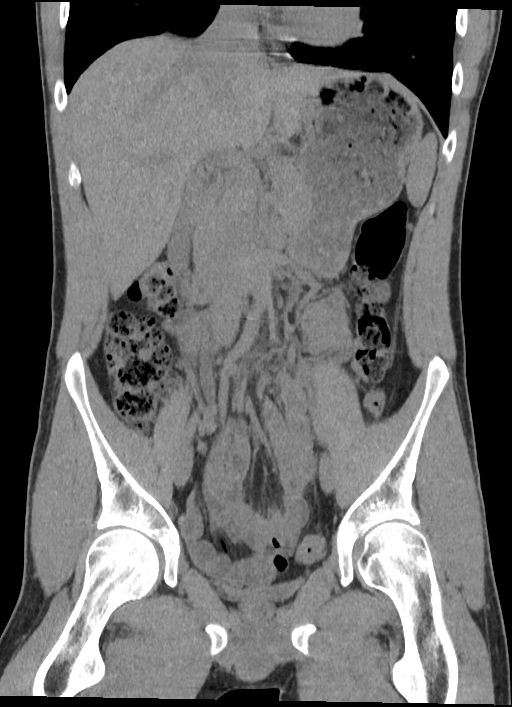
[im 57/102  soft-tissue]
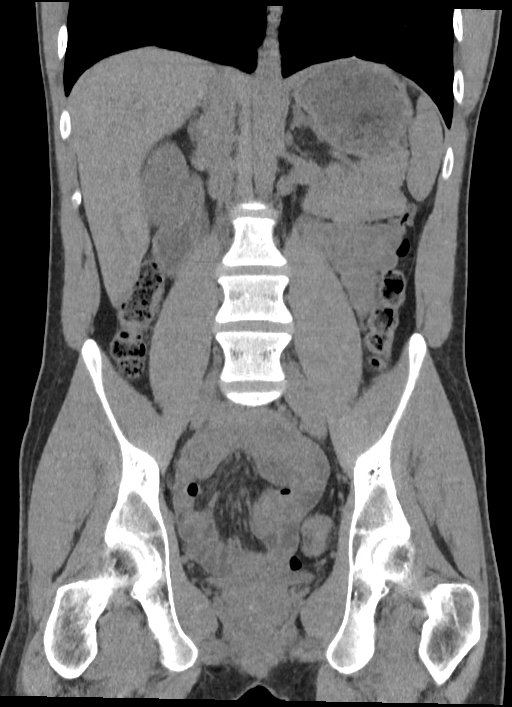

[16 of 46 positions shown; findings below may reference images not displayed]

FINDINGS: Lower chest: No acute pleural or parenchymal lung disease.

Hepatobiliary: Unremarkable unenhanced appearance of the liver and
gallbladder.

Pancreas: Unremarkable unenhanced appearance.

Spleen: Unremarkable unenhanced appearance.

Adrenals/Urinary Tract: There is a nonobstructing 4 mm right renal
calculus. Left kidney is unremarkable. No obstructive uropathy
within either kidney. The bladder is decompressed, limiting its
evaluation. Adrenals are unremarkable.

Stomach/Bowel: No bowel obstruction or ileus. The appendix, if still
present, is not well visualized. No bowel wall thickening or
inflammatory change.

Vascular/Lymphatic: No significant vascular findings are present. No
enlarged abdominal or pelvic lymph nodes.

Reproductive: Prostate is unremarkable.

Other: No free fluid or free gas.  No abdominal wall hernia.

Musculoskeletal: No acute or destructive bony lesions. Reconstructed
images demonstrate no additional findings.
IMPRESSION: 1. Stable nonobstructing 4 mm right renal calculus.
2. Otherwise unremarkable unenhanced exam.
